# Patient Record
Sex: Male | Born: 1985 | Race: Black or African American | Hispanic: No | Marital: Single | Smoking: Never smoker
Health system: Southern US, Community
[De-identification: ages and names within clinical notes are randomized; demographics above are authoritative.]

## PROBLEM LIST (undated history)

## (undated) ENCOUNTER — Ambulatory Visit: Payer: Self-pay | Source: Home / Self Care

---
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
## 2004-10-26 ENCOUNTER — Emergency Department (HOSPITAL_COMMUNITY): Admission: EM | Admit: 2004-10-26 | Discharge: 2004-10-26 | Payer: Self-pay | Admitting: Emergency Medicine

## 2009-03-11 ENCOUNTER — Emergency Department (HOSPITAL_COMMUNITY): Admission: EM | Admit: 2009-03-11 | Discharge: 2009-03-11 | Payer: Self-pay | Admitting: Emergency Medicine

## 2009-07-31 ENCOUNTER — Emergency Department (HOSPITAL_COMMUNITY): Admission: EM | Admit: 2009-07-31 | Discharge: 2009-07-31 | Payer: Self-pay | Admitting: Emergency Medicine

## 2009-09-30 ENCOUNTER — Emergency Department (HOSPITAL_COMMUNITY): Admission: EM | Admit: 2009-09-30 | Discharge: 2009-09-30 | Payer: Self-pay | Admitting: Emergency Medicine

## 2011-04-04 LAB — COMPREHENSIVE METABOLIC PANEL
ALT: 24 U/L (ref 0–53)
AST: 33 U/L (ref 0–37)
Calcium: 9.3 mg/dL (ref 8.4–10.5)
GFR calc Af Amer: >60 mL/min (ref >60)
Sodium: 141 mEq/L (ref 135–145)
Total Protein: 7.3 g/dL (ref 6.0–8.3)

## 2011-11-28 ENCOUNTER — Emergency Department (HOSPITAL_COMMUNITY): Payer: Self-pay

## 2011-11-28 ENCOUNTER — Encounter: Payer: Self-pay | Admitting: Emergency Medicine

## 2011-11-28 ENCOUNTER — Emergency Department (HOSPITAL_COMMUNITY)
Admission: EM | Admit: 2011-11-28 | Discharge: 2011-11-29 | Disposition: A | Discharge status: Home / Self Care | Payer: Self-pay | Attending: Emergency Medicine | Admitting: Emergency Medicine

## 2011-11-28 DIAGNOSIS — S0081XA Abrasion of other part of head, initial encounter: Secondary | ICD-10-CM

## 2011-11-28 DIAGNOSIS — S0083XA Contusion of other part of head, initial encounter: Secondary | ICD-10-CM

## 2011-11-28 DIAGNOSIS — S1093XA Contusion of unspecified part of neck, initial encounter: Secondary | ICD-10-CM | POA: Insufficient documentation

## 2011-11-28 DIAGNOSIS — S0180XA Unspecified open wound of other part of head, initial encounter: Secondary | ICD-10-CM | POA: Insufficient documentation

## 2011-11-28 DIAGNOSIS — R6884 Jaw pain: Secondary | ICD-10-CM | POA: Insufficient documentation

## 2011-11-28 DIAGNOSIS — S0003XA Contusion of scalp, initial encounter: Secondary | ICD-10-CM | POA: Insufficient documentation

## 2011-11-28 DIAGNOSIS — IMO0002 Reserved for concepts with insufficient information to code with codable children: Secondary | ICD-10-CM | POA: Insufficient documentation

## 2011-11-28 MED ORDER — HYDROCODONE-ACETAMINOPHEN 5-325 MG PO TABS: 1.0000 | ORAL_TABLET | ORAL | Status: AC | PRN

## 2011-11-28 MED ORDER — TETANUS-DIPHTH-ACELL PERTUSSIS 5-2.5-18.5 LF-MCG/0.5 IM SUSP
0.5000 mL | Freq: Once | INTRAMUSCULAR | Status: AC
  Administered 2011-11-29: 0.5 mL via INTRAMUSCULAR
  Filled 2011-11-28: qty 0.5

## 2011-11-28 NOTE — ED Provider Notes (Signed)
Medical screening examination/treatment/procedure(s) were performed by non-physician practitioner and as supervising physician I was immediately available for consultation/collaboration.  Eliyahu Bille K Linker, MD 11/28/11 2347 
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
## 2013-07-06 ENCOUNTER — Emergency Department (HOSPITAL_COMMUNITY)
Admission: EM | Admit: 2013-07-06 | Discharge: 2013-07-06 | Disposition: A | Discharge status: Home / Self Care | Payer: No Typology Code available for payment source | Attending: Emergency Medicine | Admitting: Emergency Medicine

## 2013-07-06 ENCOUNTER — Emergency Department (HOSPITAL_COMMUNITY): Payer: Self-pay

## 2013-07-06 ENCOUNTER — Emergency Department (HOSPITAL_COMMUNITY): Payer: No Typology Code available for payment source

## 2013-07-06 ENCOUNTER — Encounter (HOSPITAL_COMMUNITY): Payer: Self-pay | Admitting: Nurse Practitioner

## 2013-07-06 DIAGNOSIS — S0993XA Unspecified injury of face, initial encounter: Secondary | ICD-10-CM | POA: Insufficient documentation

## 2013-07-06 DIAGNOSIS — N889 Noninflammatory disorder of cervix uteri, unspecified: Secondary | ICD-10-CM

## 2013-07-06 DIAGNOSIS — S199XXA Unspecified injury of neck, initial encounter: Secondary | ICD-10-CM | POA: Insufficient documentation

## 2013-07-06 DIAGNOSIS — S0990XA Unspecified injury of head, initial encounter: Secondary | ICD-10-CM

## 2013-07-06 DIAGNOSIS — Y9389 Activity, other specified: Secondary | ICD-10-CM | POA: Insufficient documentation

## 2013-07-06 DIAGNOSIS — R42 Dizziness and giddiness: Secondary | ICD-10-CM | POA: Insufficient documentation

## 2013-07-06 DIAGNOSIS — H538 Other visual disturbances: Secondary | ICD-10-CM | POA: Insufficient documentation

## 2013-07-06 MED ORDER — HYDROCODONE-ACETAMINOPHEN 5-325 MG PO TABS: 1.0000 | ORAL_TABLET | Freq: Four times a day (QID) | ORAL | Status: DC | PRN

## 2013-07-06 MED ORDER — IBUPROFEN 800 MG PO TABS: 800.0000 mg | ORAL_TABLET | Freq: Three times a day (TID) | ORAL | Status: DC | PRN

## 2013-07-06 MED ORDER — CYCLOBENZAPRINE HCL 10 MG PO TABS: 10.0000 mg | ORAL_TABLET | Freq: Three times a day (TID) | ORAL | Status: DC | PRN

## 2013-07-06 NOTE — ED Notes (Signed)
Patient transported to X-ray 
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
## 2013-07-06 NOTE — ED Provider Notes (Signed)
Medical screening examination/treatment/procedure(s) were performed by non-physician practitioner and as supervising physician I was immediately available for consultation/collaboration.   Ethne Jeon III, MD 07/06/13 2118 
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
## 2015-06-07 ENCOUNTER — Emergency Department (HOSPITAL_COMMUNITY)
Admission: EM | Admit: 2015-06-07 | Discharge: 2015-06-07 | Disposition: A | Discharge status: Home / Self Care | Payer: Self-pay | Attending: Emergency Medicine | Admitting: Emergency Medicine

## 2015-06-07 ENCOUNTER — Encounter (HOSPITAL_COMMUNITY): Payer: Self-pay

## 2015-06-07 DIAGNOSIS — K921 Melena: Secondary | ICD-10-CM | POA: Insufficient documentation

## 2015-06-07 DIAGNOSIS — K6289 Other specified diseases of anus and rectum: Secondary | ICD-10-CM | POA: Insufficient documentation

## 2015-06-07 DIAGNOSIS — Z79899 Other long term (current) drug therapy: Secondary | ICD-10-CM | POA: Insufficient documentation

## 2015-06-07 MED ORDER — HYDROCORTISONE 2.5 % RE CREA: TOPICAL_CREAM | RECTAL | Status: DC

## 2015-06-07 MED ORDER — DOCUSATE SODIUM 100 MG PO CAPS: 100.0000 mg | ORAL_CAPSULE | Freq: Two times a day (BID) | ORAL | Status: DC

## 2015-06-07 NOTE — ED Provider Notes (Signed)
CSN: [id number]     Arrival date & time 06/07/15  0429 History   First MD Initiated Contact with Patient 06/07/15 0444     Chief Complaint  Patient presents with  . Hemorrhoids     (Consider location/radiation/quality/duration/timing/severity/associated sxs/prior Treatment) HPI  This is a 29 year old male who presents with rectal pain and hemorrhoids. Patient reports rectal pain and hemorrhoids 2 months. He's been using Preparation H at home and sitz baths without relief. Reports the pain is Worse. He has noted some bloody stools with bowel movements. His pain does not get better after a bowel movement. He is noted the hemorrhoid gets worse after a bowel movement. He is not taking stool softeners but does not report hard stools either.  History reviewed. No pertinent past medical history. History reviewed. No pertinent past surgical history. No family history on file. History  Substance Use Topics  . Smoking status: Never Smoker   . Smokeless tobacco: Not on file  . Alcohol Use: No    Review of Systems  Constitutional: Negative for fever.  Gastrointestinal: Positive for blood in stool and rectal pain. Negative for abdominal pain.  All other systems reviewed and are negative.     Allergies  Review of patient's allergies indicates no known allergies.  Home Medications   Prior to Admission medications   Medication Sig Start Date End Date Taking? Authorizing Provider  cyclobenzaprine (FLEXERIL) 10 MG tablet Take 1 tablet (10 mg total) by mouth 3 (three) times daily as needed for muscle spasms. 07/06/13   Charlestine Night, PA-C  docusate sodium (COLACE) 100 MG capsule Take 1 capsule (100 mg total) by mouth every 12 (twelve) hours. 06/07/15   Shon Baton, MD  HYDROcodone-acetaminophen (NORCO/VICODIN) 5-325 MG per tablet Take 1 tablet by mouth every 6 (six) hours as needed for pain. 07/06/13   Charlestine Night, PA-C  hydrocortisone (ANUSOL-HC) 2.5 % rectal cream Apply rectally  2 times daily 06/07/15   Shon Baton, MD  ibuprofen (ADVIL,MOTRIN) 800 MG tablet Take 1 tablet (800 mg total) by mouth every 8 (eight) hours as needed for pain. 07/06/13   Christopher Lawyer, PA-C   BP 134/105 mmHg  Pulse 98  Temp(Src) 97.8 F (36.6 C) (Oral)  Resp 19  Ht 6' (1.829 m)  Wt 200 lb (90.719 kg)  BMI 27.12 kg/m2  SpO2 99% Physical Exam  Constitutional: He is oriented to person, place, and time. He appears well-developed and well-nourished. No distress.  HENT:  Head: Normocephalic and atraumatic.  Cardiovascular: Normal rate, regular rhythm and normal heart sounds.   Pulmonary/Chest: Effort normal. No respiratory distress.  Genitourinary:  Normal rectal tone, no masses noted, patient had difficulty with digital rectal exam, no obvious mass or hemorrhoid palpated, no external hemorrhoids  Musculoskeletal: He exhibits no edema.  Neurological: He is alert and oriented to person, place, and time.  Skin: Skin is warm and dry.  Psychiatric: He has a normal mood and affect.  Nursing note and vitals reviewed.   ED Course  Procedures (including critical care time) Labs Review Labs Reviewed - No data to display  Imaging Review No results found.   EKG Interpretation None      MDM   Final diagnoses:  Rectal pain    Patient presents with rectal pain and concern for hemorrhoid. Symptoms for greater than 2 months. Nontoxic on exam. No obvious palpable internal or external hemorrhoid. Patient is adamant that it's a hemorrhoid that "pops out after he poops."  We will treat  supportively as a hemorrhoid. Patient given Anusol. Encouraged to continue sitz baths and use stool softeners. Patient also given [facility] follow-up.  After history, exam, and medical workup I feel the patient has been appropriately medically screened and is safe for discharge home. Pertinent diagnoses were discussed with the patient. Patient was given return precautions.     Shon Batonourtney F Keshia Weare,  MD 06/07/15 [phone]

## 2015-06-07 NOTE — ED Notes (Signed)
Hemorrhoid x 2 months, pt states using preparation H at home and baths daily, pain unbearable today.

## 2015-06-07 NOTE — Discharge Instructions (Signed)
You were seen today for rectal pain and possible hemorrhoids. There was no hemorrhoid palpated on her exam; however, given her symptoms, will treat as a hemorrhoid. You need to continue sitz baths. He'll be given rectal cream and should take a stool softener.  Hemorrhoids Hemorrhoids are swollen veins around the rectum or anus. There are two types of hemorrhoids:   Internal hemorrhoids. These occur in the veins just inside the rectum. They may poke through to the outside and become irritated and painful.  External hemorrhoids. These occur in the veins outside the anus and can be felt as a painful swelling or hard lump near the anus. CAUSES  Pregnancy.   Obesity.   Constipation or diarrhea.   Straining to have a bowel movement.   Sitting for long periods on the toilet.  Heavy lifting or other activity that caused you to strain.  Anal intercourse. SYMPTOMS   Pain.   Anal itching or irritation.   Rectal bleeding.   Fecal leakage.   Anal swelling.   One or more lumps around the anus.  DIAGNOSIS  Your caregiver may be able to diagnose hemorrhoids by visual examination. Other examinations or tests that may be performed include:   Examination of the rectal area with a gloved hand (digital rectal exam).   Examination of anal canal using a small tube (scope).   A blood test if you have lost a significant amount of blood.  A test to look inside the colon (sigmoidoscopy or colonoscopy). TREATMENT Most hemorrhoids can be treated at home. However, if symptoms do not seem to be getting better or if you have a lot of rectal bleeding, your caregiver may perform a procedure to help make the hemorrhoids get smaller or remove them completely. Possible treatments include:   Placing a rubber band at the base of the hemorrhoid to cut off the circulation (rubber band ligation).   Injecting a chemical to shrink the hemorrhoid (sclerotherapy).   Using a tool to burn the  hemorrhoid (infrared light therapy).   Surgically removing the hemorrhoid (hemorrhoidectomy).   Stapling the hemorrhoid to block blood flow to the tissue (hemorrhoid stapling).  HOME CARE INSTRUCTIONS   Eat foods with fiber, such as whole grains, beans, nuts, fruits, and vegetables. Ask your doctor about taking products with added fiber in them (fibersupplements).  Increase fluid intake. Drink enough water and fluids to keep your urine clear or pale yellow.   Exercise regularly.   Go to the bathroom when you have the urge to have a bowel movement. Do not wait.   Avoid straining to have bowel movements.   Keep the anal area dry and clean. Use wet toilet paper or moist towelettes after a bowel movement.   Medicated creams and suppositories may be used or applied as directed.   Only take over-the-counter or prescription medicines as directed by your caregiver.   Take warm sitz baths for 15-20 minutes, 3-4 times a day to ease pain and discomfort.   Place ice packs on the hemorrhoids if they are tender and swollen. Using ice packs between sitz baths may be helpful.   Put ice in a plastic bag.   Place a towel between your skin and the bag.   Leave the ice on for 15-20 minutes, 3-4 times a day.   Do not use a donut-shaped pillow or sit on the toilet for long periods. This increases blood pooling and pain.  SEEK MEDICAL CARE IF:  You have increasing pain and swelling  that is not controlled by treatment or medicine.  You have uncontrolled bleeding.  You have difficulty or you are unable to have a bowel movement.  You have pain or inflammation outside the area of the hemorrhoids. MAKE SURE YOU:  Understand these instructions.  Will watch your condition.  Will get help right away if you are not doing well or get worse. Document Released: 12/13/2000 Document Revised: 12/02/2012 Document Reviewed: 10/20/2012 [facility] Patient Information 2015 [place], [place].  This information is not intended to replace advice given to you by your health care provider. Make sure you discuss any questions you have with your health care provider.

## 2015-06-07 NOTE — ED Notes (Signed)
Pt states that he has a Hemorid for two months now, today pain is unbearable, pt states it keeps falling out.

## 2015-06-09 ENCOUNTER — Encounter (HOSPITAL_COMMUNITY): Payer: Self-pay | Admitting: *Deleted

## 2015-06-09 ENCOUNTER — Emergency Department (INDEPENDENT_AMBULATORY_CARE_PROVIDER_SITE_OTHER)
Admission: EM | Admit: 2015-06-09 | Discharge: 2015-06-09 | Disposition: A | Discharge status: Home / Self Care | Payer: Self-pay | Source: Home / Self Care | Attending: Family Medicine | Admitting: Family Medicine

## 2015-06-09 DIAGNOSIS — K648 Other hemorrhoids: Secondary | ICD-10-CM

## 2015-06-09 MED ORDER — BENZOCAINE 20 % RE OINT: TOPICAL_OINTMENT | RECTAL | Status: DC | PRN

## 2015-06-09 NOTE — ED Provider Notes (Signed)
CSN: [id number]     Arrival date & time 06/09/15  1304 History   First MD Initiated Contact with Patient 06/09/15 1347     Chief Complaint  Patient presents with  . Rectal Pain   (Consider location/radiation/quality/duration/timing/severity/associated sxs/prior Treatment) HPI Comments: 29 year old male states that he is having hemorrhoidal pain. He has been having hemorrhoids and pain almost consistently for several years. Over the past 2-3 months it has been getting worse. He is complaining of rectal pain associated with bleeding. He has also had frequent external hemorrhoids in which he has been able to self reduce. He was in the emergency department 2 days ago with rectal pain and bleedin and was prescribed Anusol HC cream. He states that has not helped.    History reviewed. No pertinent past medical history. History reviewed. No pertinent past surgical history. History reviewed. No pertinent family history. History  Substance Use Topics  . Smoking status: Never Smoker   . Smokeless tobacco: Not on file  . Alcohol Use: No    Review of Systems  Constitutional: Positive for activity change. Negative for fever and fatigue.  Gastrointestinal: Positive for blood in stool and anal bleeding. Negative for nausea, vomiting, abdominal pain, constipation and abdominal distention.  Genitourinary: Negative.  Negative for dysuria, urgency, flank pain, decreased urine volume, discharge, penile swelling, scrotal swelling and testicular pain.  Skin: Negative.   All other systems reviewed and are negative.   Allergies  Review of patient's allergies indicates no known allergies.  Home Medications   Prior to Admission medications   Medication Sig Start Date End Date Taking? Authorizing Provider  benzocaine (AMERICAINE) 20 % rectal ointment Place rectally every 3 (three) hours as needed for pain. 06/09/15   Hayden Rasmussen, NP  cyclobenzaprine (FLEXERIL) 10 MG tablet Take 1 tablet (10 mg total) by mouth  3 (three) times daily as needed for muscle spasms. 07/06/13   Charlestine Night, PA-C  docusate sodium (COLACE) 100 MG capsule Take 1 capsule (100 mg total) by mouth every 12 (twelve) hours. 06/07/15   Shon Baton, MD  HYDROcodone-acetaminophen (NORCO/VICODIN) 5-325 MG per tablet Take 1 tablet by mouth every 6 (six) hours as needed for pain. 07/06/13   Charlestine Night, PA-C  hydrocortisone (ANUSOL-HC) 2.5 % rectal cream Apply rectally 2 times daily 06/07/15   Shon Baton, MD  ibuprofen (ADVIL,MOTRIN) 800 MG tablet Take 1 tablet (800 mg total) by mouth every 8 (eight) hours as needed for pain. 07/06/13   Christopher Lawyer, PA-C   BP 111/71 mmHg  Pulse 75  Temp(Src) 99.6 F (37.6 C) (Oral)  Resp 16  SpO2 99% Physical Exam  Constitutional: He is oriented to person, place, and time. He appears well-developed and well-nourished. No distress.  Pulmonary/Chest: Effort normal. No respiratory distress.  Genitourinary:  Normal External male genitalia. Bilateral testicles descended. No swelling or tenderness of the testicles or scrotum. No discoloration. No tenderness to the epididymal apparatus. Pressure applied to the perineum while standing reproduces terminal rectal pain. No surrounding lymphadenopathy.  Musculoskeletal: He exhibits no edema.  Neurological: He is alert and oriented to person, place, and time. He exhibits normal muscle tone.  Skin: Skin is warm.  Psychiatric: He has a normal mood and affect.  Nursing note and vitals reviewed.   ED Course  Procedures (including critical care time) Labs Review Labs Reviewed - No data to display  Imaging Review No results found.   MDM   1. Internal bleeding hemorrhoids    Use  the  benzocaine pain killing ointment with the hydrocortisone cream. Use at least 1 hour apart. Stool softener tid with meals Moist heat apps F/U with surgeon, has had these hemorroids for several months.     Hayden Rasmussen, NP 06/09/15 1500

## 2015-06-09 NOTE — ED Notes (Signed)
Pt  Reports    Rectal  Pain      Seen  In  [facility]  Days  Ago  Given meds  They  Are  Not  Helping      He  States   Noticed   Some  Rectal bleeding  As  Well

## 2015-06-09 NOTE — Discharge Instructions (Signed)
Hemorrhoids Use the benzocaine pain killing ointment with the hydrocortisone cream. Use at least 1 hour apart. Hemorrhoids are swollen veins around the rectum or anus. There are two types of hemorrhoids:   Internal hemorrhoids. These occur in the veins just inside the rectum. They may poke through to the outside and become irritated and painful.  External hemorrhoids. These occur in the veins outside the anus and can be felt as a painful swelling or hard lump near the anus. CAUSES  Pregnancy.   Obesity.   Constipation or diarrhea.   Straining to have a bowel movement.   Sitting for long periods on the toilet.  Heavy lifting or other activity that caused you to strain.  Anal intercourse. SYMPTOMS   Pain.   Anal itching or irritation.   Rectal bleeding.   Fecal leakage.   Anal swelling.   One or more lumps around the anus.  DIAGNOSIS  Your caregiver may be able to diagnose hemorrhoids by visual examination. Other examinations or tests that may be performed include:   Examination of the rectal area with a gloved hand (digital rectal exam).   Examination of anal canal using a small tube (scope).   A blood test if you have lost a significant amount of blood.  A test to look inside the colon (sigmoidoscopy or colonoscopy). TREATMENT Most hemorrhoids can be treated at home. However, if symptoms do not seem to be getting better or if you have a lot of rectal bleeding, your caregiver may perform a procedure to help make the hemorrhoids get smaller or remove them completely. Possible treatments include:   Placing a rubber band at the base of the hemorrhoid to cut off the circulation (rubber band ligation).   Injecting a chemical to shrink the hemorrhoid (sclerotherapy).   Using a tool to burn the hemorrhoid (infrared light therapy).   Surgically removing the hemorrhoid (hemorrhoidectomy).   Stapling the hemorrhoid to block blood flow to the tissue  (hemorrhoid stapling).  HOME CARE INSTRUCTIONS   Eat foods with fiber, such as whole grains, beans, nuts, fruits, and vegetables. Ask your doctor about taking products with added fiber in them (fibersupplements).  Increase fluid intake. Drink enough water and fluids to keep your urine clear or pale yellow.   Exercise regularly.   Go to the bathroom when you have the urge to have a bowel movement. Do not wait.   Avoid straining to have bowel movements.   Keep the anal area dry and clean. Use wet toilet paper or moist towelettes after a bowel movement.   Medicated creams and suppositories may be used or applied as directed.   Only take over-the-counter or prescription medicines as directed by your caregiver.   Take warm sitz baths for 15-20 minutes, 3-4 times a day to ease pain and discomfort.   Place ice packs on the hemorrhoids if they are tender and swollen. Using ice packs between sitz baths may be helpful.   Put ice in a plastic bag.   Place a towel between your skin and the bag.   Leave the ice on for 15-20 minutes, 3-4 times a day.   Do not use a donut-shaped pillow or sit on the toilet for long periods. This increases blood pooling and pain.  SEEK MEDICAL CARE IF:  You have increasing pain and swelling that is not controlled by treatment or medicine.  You have uncontrolled bleeding.  You have difficulty or you are unable to have a bowel movement.  You have  pain or inflammation outside the area of the hemorrhoids. MAKE SURE YOU:  Understand these instructions.  Will watch your condition.  Will get help right away if you are not doing well or get worse. Document Released: 12/13/2000 Document Revised: 12/02/2012 Document Reviewed: 10/20/2012 [facility] Patient Information 2015 [place], [place]. This information is not intended to replace advice given to you by your health care provider. Make sure you discuss any questions you have with your health care  provider.

## 2016-12-01 ENCOUNTER — Encounter (HOSPITAL_COMMUNITY): Payer: Self-pay

## 2016-12-01 ENCOUNTER — Emergency Department (HOSPITAL_COMMUNITY)
Admission: EM | Admit: 2016-12-01 | Discharge: 2016-12-01 | Disposition: A | Discharge status: Home / Self Care | Payer: Self-pay | Attending: Emergency Medicine | Admitting: Emergency Medicine

## 2016-12-01 DIAGNOSIS — H00014 Hordeolum externum left upper eyelid: Secondary | ICD-10-CM | POA: Insufficient documentation

## 2016-12-01 DIAGNOSIS — Z79899 Other long term (current) drug therapy: Secondary | ICD-10-CM | POA: Insufficient documentation

## 2016-12-01 MED ORDER — NAPROXEN 250 MG PO TABS: 250.0000 mg | ORAL_TABLET | Freq: Two times a day (BID) | ORAL | 0 refills | Status: DC

## 2016-12-01 MED ORDER — TETRACAINE HCL 0.5 % OP SOLN
2.0000 [drp] | Freq: Once | OPHTHALMIC | Status: DC
  Filled 2016-12-01: qty 2

## 2016-12-01 MED ORDER — FLUORESCEIN SODIUM 1 MG OP STRP
1.0000 | ORAL_STRIP | Freq: Once | OPHTHALMIC | Status: DC
  Filled 2016-12-01: qty 1

## 2016-12-01 MED ORDER — SULFAMETHOXAZOLE-TRIMETHOPRIM 800-160 MG PO TABS: 1.0000 | ORAL_TABLET | Freq: Two times a day (BID) | ORAL | 0 refills | Status: AC

## 2016-12-01 NOTE — ED Provider Notes (Signed)
[facility] Provider Note    By signing my name below, I, Xavier Hill, attest that this documentation has been prepared under the direction and in the presence of Will Daneshia Tavano, PA-C. Electronically Signed: Earmon Hill, ED Scribe. 12/01/16. 9:54 AM.   History   Chief Complaint Chief Complaint  Patient presents with  . Eye Problem    The history is provided by the patient and medical records. No language interpreter was used.    HPI Comments:  Xavier Hill is a 30 y.o. male who presents to the Emergency Department complaining of left eyelid swelling that began about four days ago. He reports associated redness and mild crusting in the morning. He does not wear contacts or glasses. He has not taken anything to treat his symptoms. Pt has applied warm compresses with no significant relief of the He denies modifying factors. He denies fever, chills, foreign body sensation, visual changes.   History reviewed. No pertinent past medical history.  There are no active problems to display for this patient.   History reviewed. No pertinent surgical history.     Home Medications    Prior to Admission medications   Medication Sig Start Date End Date Taking? Authorizing Provider  benzocaine (AMERICAINE) 20 % rectal ointment Place rectally every 3 (three) hours as needed for pain. 06/09/15   Hayden Rasmussen, NP  cyclobenzaprine (FLEXERIL) 10 MG tablet Take 1 tablet (10 mg total) by mouth 3 (three) times daily as needed for muscle spasms. 07/06/13   Charlestine Night, PA-C  docusate sodium (COLACE) 100 MG capsule Take 1 capsule (100 mg total) by mouth every 12 (twelve) hours. 06/07/15   Shon Baton, MD  HYDROcodone-acetaminophen (NORCO/VICODIN) 5-325 MG per tablet Take 1 tablet by mouth every 6 (six) hours as needed for pain. 07/06/13   Charlestine Night, PA-C  hydrocortisone (ANUSOL-HC) 2.5 % rectal cream Apply rectally 2 times daily 06/07/15   Shon Baton, MD  ibuprofen  (ADVIL,MOTRIN) 800 MG tablet Take 1 tablet (800 mg total) by mouth every 8 (eight) hours as needed for pain. 07/06/13   Charlestine Night, PA-C  naproxen (NAPROSYN) 250 MG tablet Take 1 tablet (250 mg total) by mouth 2 (two) times daily with a meal. 12/01/16   Everlene Farrier, PA-C  sulfamethoxazole-trimethoprim (BACTRIM DS,SEPTRA DS) 800-160 MG tablet Take 1 tablet by mouth 2 (two) times daily. 12/01/16 12/08/16  Everlene Farrier, PA-C    Family History No family history on file.  Social History Social History  Substance Use Topics  . Smoking status: Never Smoker  . Smokeless tobacco: Not on file  . Alcohol use No     Allergies   Patient has no known allergies.   Review of Systems Review of Systems  Constitutional: Negative for chills and fever.  HENT: Negative for congestion and mouth sores.   Eyes: Negative for photophobia, pain, discharge, redness, itching and visual disturbance.       Left eyelid swelling  Skin: Negative for rash and wound.  Neurological: Negative for light-headedness and headaches.     Physical Exam Updated Vital Signs BP 132/90 (BP Location: Left Arm)   Pulse 68   Temp 97.6 F (36.4 C) (Oral)   Resp 20   SpO2 100%   Physical Exam  Constitutional: He appears well-developed and well-nourished. No distress.  HENT:  Head: Normocephalic and atraumatic.  Right Ear: External ear normal.  Left Ear: External ear normal.  Mouth/Throat: Oropharynx is clear and moist.  Eyes: Conjunctivae and EOM are normal.  Pupils are equal, round, and reactive to light. Right eye exhibits no discharge. Left eye exhibits hordeolum. Left eye exhibits no discharge.    Visual Acuity  Right Eye Distance: 20/50 Left Eye Distance: 20/30 Bilateral Distance: 20/30  Right Eye Near:   Left Eye Near:    Bilateral Near:     Erythema and edema over left upper eyelid with focal area of inflammation consistent with hordeolum. No conjunctival injection. EOMs intact. Vision grossly  intact. No pain with EOMs. Eyelid margins are clear.   Cardiovascular: Normal rate, regular rhythm and intact distal pulses.   Pulmonary/Chest: Effort normal. No respiratory distress.  Neurological: He is alert. Coordination normal.  Skin: Skin is warm and dry. Capillary refill takes less than 2 seconds. No rash noted. He is not diaphoretic. No erythema. No pallor.  Psychiatric: He has a normal mood and affect. His behavior is normal.  Nursing note and vitals reviewed.    ED Treatments / Results   DIAGNOSTIC STUDIES: Oxygen Saturation is 100% on RA, normal by my interpretation.   COORDINATION OF CARE: 9:47 AM- Will prescribe Bactrim and give referral to ophthalmologist. Pt verbalizes understanding and agrees to plan.  Medications - No data to display Labs (all labs ordered are listed, but only abnormal results are displayed) Labs Reviewed - No data to display  EKG  EKG Interpretation None       Radiology No results found.  Procedures Procedures (including critical care time)  Medications Ordered in ED Medications - No data to display   Initial Impression / Assessment and Plan / ED Course  I have reviewed the triage vital signs and the nursing notes.  Pertinent labs & imaging results that were available during my care of the patient were reviewed by me and considered in my medical decision making (see chart for details).  Clinical Course    This  is a 30 y.o. male who presents to the Emergency Department complaining of left eyelid swelling that began about four days ago. He reports associated redness and mild crusting in the morning. He does not wear contacts or glasses.  On exam the patient is afebrile and nontoxic appearing. He has some swelling and erythema to his left upper eyelid with a focal area of inflammation. This is consistent with an hordeolum. I would margins are clear. EOMs are intact. No pain with EOMs. No concern for orbital cellulitis. Will cover with  Bactrim for any preseptal cellulitis. I encouraged him to use warm compresses and follow-up with ophthalmologist Dr. Allena KatzPatel.  Pt discharged with prescription for Bactrim and Naprosyn for pain.  Patient advised to follow up with ophthalmologist and referral given. Return precautions discussed. Patient verbalizes understanding and is agreeable with discharge. I advised the patient to follow-up with their primary care provider this week. I advised the patient to return to the emergency department with new or worsening symptoms or new concerns. The patient verbalized understanding and agreement with plan.     I personally performed the services described in this documentation, which was scribed in my presence. The recorded information has been reviewed and is accurate.      Final Clinical Impressions(s) / ED Diagnoses   Final diagnoses:  Hordeolum externum left upper eyelid    New Prescriptions New Prescriptions   NAPROXEN (NAPROSYN) 250 MG TABLET    Take 1 tablet (250 mg total) by mouth 2 (two) times daily with a meal.   SULFAMETHOXAZOLE-TRIMETHOPRIM (BACTRIM DS,SEPTRA DS) 800-160 MG TABLET    Take  1 tablet by mouth 2 (two) times daily.     Everlene FarrierWilliam Fahmida Jurich, PA-C 12/01/16 1001    Vanetta MuldersScott Zackowski, MD 12/04/16 [phone]

## 2016-12-01 NOTE — Discharge Instructions (Signed)
Please use warm compresses 6 times per day while awake.

## 2016-12-01 NOTE — ED Triage Notes (Signed)
Patient complains of left eye swelling and mild redness with drainage x 4 days, denies trauma, no blurred vision. Denies pain

## 2016-12-01 NOTE — ED Notes (Signed)
Declined W/C at D/C and was escorted to lobby by RN. 

## 2017-02-05 ENCOUNTER — Encounter (HOSPITAL_COMMUNITY): Payer: Self-pay | Admitting: Family Medicine

## 2017-02-05 ENCOUNTER — Emergency Department (HOSPITAL_COMMUNITY)
Admission: EM | Admit: 2017-02-05 | Discharge: 2017-02-05 | Disposition: A | Discharge status: Home / Self Care | Payer: Self-pay | Attending: Emergency Medicine | Admitting: Emergency Medicine

## 2017-02-05 DIAGNOSIS — Z5321 Procedure and treatment not carried out due to patient leaving prior to being seen by health care provider: Secondary | ICD-10-CM | POA: Insufficient documentation

## 2017-02-05 DIAGNOSIS — R109 Unspecified abdominal pain: Secondary | ICD-10-CM | POA: Insufficient documentation

## 2017-02-05 NOTE — ED Notes (Signed)
Patient is requesting to speak to charge RN due to not being given ice chips or a bed. Consulting civil engineerCharge RN to room. Patient laying in floor in triage room.

## 2017-02-05 NOTE — ED Notes (Signed)
Called patient for assigned room and no answer.  

## 2017-02-05 NOTE — ED Notes (Signed)
When patient arrived to triage, pt requested a room and ice chips/water. Apologized to patient about not having any available rooms but informed orders for his care would be initiated once he was triaged. Also, informed he could not have any water until assessed by a provider. When triaging, pt laid on the floor and stated he didn't care that the floor was dirty. Unable to get vital signs because would not sit in a chair. After triaging, he walked out of the triage room into the lobby with a steady gait. No vomiting while in triage room.

## 2017-02-05 NOTE — ED Triage Notes (Signed)
Per EMS, patient is complaining of abdominal pain, nausea, vomiting, diarrhea starting at 9am. Patient states he drank 3 shots of vodka last night.

## 2017-02-05 NOTE — ED Notes (Signed)
Call patient name to collect labs and no one answer

## 2017-02-06 NOTE — ED Provider Notes (Signed)
Patient left without being seen  1. Patient left without being seen       Melene Planan Jorge Retz, DO 02/06/17 0006

## 2017-06-25 ENCOUNTER — Encounter (HOSPITAL_COMMUNITY): Payer: Self-pay

## 2017-06-25 ENCOUNTER — Ambulatory Visit (HOSPITAL_COMMUNITY)
Admission: EM | Admit: 2017-06-25 | Discharge: 2017-06-25 | Disposition: A | Discharge status: Home / Self Care | Payer: Self-pay | Attending: Family Medicine | Admitting: Family Medicine

## 2017-06-25 DIAGNOSIS — K047 Periapical abscess without sinus: Secondary | ICD-10-CM

## 2017-06-25 MED ORDER — AMOXICILLIN 875 MG PO TABS: 875.0000 mg | ORAL_TABLET | Freq: Two times a day (BID) | ORAL | 0 refills | Status: DC

## 2017-06-25 NOTE — ED Triage Notes (Signed)
[facility] of [facility] pain left side pt states he may have an abscess, pt complains of sore throat, trouble swallowing, trouble chewing, and toothache x3 days, [facility] pain

## 2017-06-25 NOTE — ED Provider Notes (Signed)
CSN: [id number]     Arrival date & time 06/25/17  1857 History   None    Chief Complaint  Patient presents with  . Dental Pain   (Consider location/radiation/quality/duration/timing/severity/associated sxs/prior Treatment) Patient c/o dental pain   The history is provided by the patient.  Dental Pain  Location:  Lower Lower teeth location:  18/LL 2nd molar Quality:  Aching Severity:  Moderate Onset quality:  Sudden Duration:  5 days Timing:  Constant Chronicity:  New Previous work-up:  Dental exam Worsened by:  Nothing Ineffective treatments:  None tried Associated symptoms: congestion     History reviewed. No pertinent past medical history. History reviewed. No pertinent surgical history. History reviewed. No pertinent family history. Social History  Substance Use Topics  . Smoking status: Never Smoker  . Smokeless tobacco: Never Used  . Alcohol use Yes     Comment: "Not often", " I drunk last night"    Review of Systems  Constitutional: Negative.   HENT: Positive for congestion.   Eyes: Negative.   Respiratory: Negative.   Cardiovascular: Negative.   Gastrointestinal: Negative.   Endocrine: Negative.   Genitourinary: Negative.   Musculoskeletal: Negative.   Allergic/Immunologic: Negative.   Neurological: Negative.   Hematological: Negative.   Psychiatric/Behavioral: Negative.     Allergies  Patient has no known allergies.  Home Medications   Prior to Admission medications   Medication Sig Start Date End Date Taking? Authorizing Provider  ibuprofen (ADVIL,MOTRIN) 800 MG tablet Take 1 tablet (800 mg total) by mouth every 8 (eight) hours as needed for pain. 07/06/13  Yes Lawyer, Cristal Deerhristopher, PA-C  amoxicillin (AMOXIL) 875 MG tablet Take 1 tablet (875 mg total) by mouth 2 (two) times daily. 06/25/17   Deatra Canterxford, Romey Cohea J, FNP  benzocaine (AMERICAINE) 20 % rectal ointment Place rectally every 3 (three) hours as needed for pain. 06/09/15   Hayden RasmussenMabe, David, NP   cyclobenzaprine (FLEXERIL) 10 MG tablet Take 1 tablet (10 mg total) by mouth 3 (three) times daily as needed for muscle spasms. 07/06/13   Lawyer, Cristal Deerhristopher, PA-C  docusate sodium (COLACE) 100 MG capsule Take 1 capsule (100 mg total) by mouth every 12 (twelve) hours. 06/07/15   Horton, Mayer Maskerourtney F, MD  HYDROcodone-acetaminophen (NORCO/VICODIN) 5-325 MG per tablet Take 1 tablet by mouth every 6 (six) hours as needed for pain. 07/06/13   Lawyer, Cristal Deerhristopher, PA-C  hydrocortisone (ANUSOL-HC) 2.5 % rectal cream Apply rectally 2 times daily 06/07/15   Horton, Mayer Maskerourtney F, MD  naproxen (NAPROSYN) 250 MG tablet Take 1 tablet (250 mg total) by mouth 2 (two) times daily with a meal. 12/01/16   Everlene Farrieransie, Jibreel Fedewa, PA-C   Meds Ordered and Administered this Visit  Medications - No data to display  BP 140/89 (BP Location: Right Arm)   Pulse 60   Temp 98.8 F (37.1 C) (Oral)   Resp 16   SpO2 100%  No data found.   Physical Exam  Constitutional: He appears well-developed and well-nourished.  HENT:  Head: Normocephalic and atraumatic.  Left lower molar discomfort.  Eyes: Conjunctivae are normal. Pupils are equal, round, and reactive to light.  Neck: Normal range of motion.  Cardiovascular: Normal rate, regular rhythm and normal heart sounds.   Pulmonary/Chest: Effort normal and breath sounds normal.  Nursing note and vitals reviewed.   Urgent Care Course     Procedures (including critical care time)  Labs Review Labs Reviewed - No data to display  Imaging Review No results found.   Visual Acuity Review  Right Eye Distance:   Left Eye Distance:   Bilateral Distance:    Right Eye Near:   Left Eye Near:    Bilateral Near:         MDM   1. Dental abscess    Amoxicillin 875mg  one po bid x 10 days #20  Dental clinic hand out given  Push po fluids, rest, tylenol and motrin otc prn as directed for fever, arthralgias, and myalgias.  Follow up prn if sx's continue or persist.     Deatra Canter, [place] 06/25/17 2028

## 2017-10-10 ENCOUNTER — Ambulatory Visit (HOSPITAL_COMMUNITY)
Admission: EM | Admit: 2017-10-10 | Discharge: 2017-10-10 | Disposition: A | Discharge status: Home / Self Care | Payer: Self-pay | Attending: Family Medicine | Admitting: Family Medicine

## 2017-10-10 ENCOUNTER — Encounter (HOSPITAL_COMMUNITY): Payer: Self-pay | Admitting: Emergency Medicine

## 2017-10-10 DIAGNOSIS — K0889 Other specified disorders of teeth and supporting structures: Secondary | ICD-10-CM

## 2017-10-10 MED ORDER — PENICILLIN V POTASSIUM 500 MG PO TABS: 500.0000 mg | ORAL_TABLET | Freq: Four times a day (QID) | ORAL | 0 refills | Status: AC

## 2017-10-10 MED ORDER — BENZOCAINE 10 % MT GEL: 1.0000 "application " | OROMUCOSAL | 0 refills | Status: DC | PRN

## 2017-10-10 NOTE — Discharge Instructions (Signed)
Start Penicillin as directed for dental abscess. Ibuprofen and oragel for pain. You can do ibuprofen 600-800mg  three times a day for pain and inflammation. Follow up with dentist for further treatment and evaluation. If experiencing swelling of the throat, trouble breathing, trouble swallowing, go to the emergency department for further evaluation.

## 2017-10-10 NOTE — ED Provider Notes (Signed)
[facility]    CSN: [id number] Arrival date & time: 10/10/17  1407     History   Chief Complaint Chief Complaint  Patient presents with  . Dental Pain    HPI Xavier Hill is a 31 y.o. male.   31 year old male comes in for 4 day history of dental pain and abscess. He has known cracked tooth at the left bottom molars, was seen here in the past for same problems. He had contacted resources provided to him during last visit, but next appointment was early next year. He states he continues to keep up dental hygiene and hasn't had a problem until current episode. Denied fever during triage, but states had one episode of fever 4 days ago that was 101, has not had any since. Has had some swelling of the left lower jaw. Has been taking ibuprofen  BID and tylenol with some relief. Denies trouble breathing, trouble swallowing, swelling of the throat.       History reviewed. No pertinent past medical history.  There are no active problems to display for this patient.   History reviewed. No pertinent surgical history.     Home Medications    Prior to Admission medications   Medication Sig Start Date End Date Taking? Authorizing Provider  benzocaine (AMERICAINE) 20 % rectal ointment Place rectally every 3 (three) hours as needed for pain. 06/09/15   Hayden Rasmussen, NP  benzocaine (ORAJEL) 10 % mucosal gel Use as directed 1 application in the mouth or throat as needed for mouth pain. 10/10/17   Cathie Hoops, Alika Eppes V, PA-C  cyclobenzaprine (FLEXERIL) 10 MG tablet Take 1 tablet (10 mg total) by mouth 3 (three) times daily as needed for muscle spasms. 07/06/13   Lawyer, Cristal Deer, PA-C  docusate sodium (COLACE) 100 MG capsule Take 1 capsule (100 mg total) by mouth every 12 (twelve) hours. 06/07/15   Horton, Mayer Masker, MD  HYDROcodone-acetaminophen (NORCO/VICODIN) 5-325 MG per tablet Take 1 tablet by mouth every 6 (six) hours as needed for pain. 07/06/13   Lawyer, Cristal Deer, PA-C    hydrocortisone (ANUSOL-HC) 2.5 % rectal cream Apply rectally 2 times daily 06/07/15   Horton, Mayer Masker, MD  ibuprofen (ADVIL,MOTRIN) 800 MG tablet Take 1 tablet (800 mg total) by mouth every 8 (eight) hours as needed for pain. 07/06/13   Lawyer, Cristal Deer, PA-C  naproxen (NAPROSYN) 250 MG tablet Take 1 tablet (250 mg total) by mouth 2 (two) times daily with a meal. 12/01/16   Everlene Farrier, PA-C  penicillin v potassium (VEETID) 500 MG tablet Take 1 tablet (500 mg total) by mouth 4 (four) times daily. 10/10/17 10/17/17  Belinda Fisher, PA-C    Family History History reviewed. No pertinent family history.  Social History Social History  Substance Use Topics  . Smoking status: Never Smoker  . Smokeless tobacco: Never Used  . Alcohol use Yes     Comment: "Not often", " I drunk last night"     Allergies   Patient has no known allergies.   Review of Systems Review of Systems  Reason unable to perform ROS: See HPI as above.     Physical Exam Triage Vital Signs ED Triage Vitals  Enc Vitals Group     BP 10/10/17 1430 119/79     Pulse Rate 10/10/17 1430 61     Resp 10/10/17 1430 20     Temp 10/10/17 1430 98.7 F (37.1 C)     Temp Source 10/10/17 1430 Oral  SpO2 10/10/17 1430 98 %     Weight --      Height --      Head Circumference --      Peak Flow --      Pain Score 10/10/17 1431 9     Pain Loc --      Pain Edu? --      Excl. in GC? --    No data found.   Updated Vital Signs BP 119/79 (BP Location: Right Arm)   Pulse 61   Temp 98.7 F (37.1 C) (Oral)   Resp 20   SpO2 98%   Physical Exam  Constitutional: He is oriented to person, place, and time. He appears well-developed and well-nourished. No distress.  HENT:  Head: Normocephalic and atraumatic.  Mouth/Throat: Uvula is midline, oropharynx is clear and moist and mucous membranes are normal.  Cracked tooth seen of #18 and #19. Swelling with tenderness on palpation of the gums. Soft floor of mouth.  Eyes:  Pupils are equal, round, and reactive to light. Conjunctivae are normal.  Neurological: He is alert and oriented to person, place, and time.     UC Treatments / Results  Labs (all labs ordered are listed, but only abnormal results are displayed) Labs Reviewed - No data to display  EKG  EKG Interpretation None       Radiology No results found.  Procedures Procedures (including critical care time)  Medications Ordered in UC Medications - No data to display   Initial Impression / Assessment and Plan / UC Course  I have reviewed the triage vital signs and the nursing notes.  Pertinent labs & imaging results that were available during my care of the patient were reviewed by me and considered in my medical decision making (see chart for details).    Start antibiotics for dental abscess. Symptomatic treatment as needed. Discussed with patient symptoms can return if dental problem is not addressed. Follow up with dentist for further evaluation and treatment of dental pain. Resources given. Return precautions given.   Final Clinical Impressions(s) / UC Diagnoses   Final diagnoses:  Pain, dental    New Prescriptions Discharge Medication List as of 10/10/2017  2:42 PM    START taking these medications   Details  benzocaine (ORAJEL) 10 % mucosal gel Use as directed 1 application in the mouth or throat as needed for mouth pain., Starting Fri 10/10/2017, Normal    penicillin v potassium (VEETID) 500 MG tablet Take 1 tablet (500 mg total) by mouth 4 (four) times daily., Starting Fri 10/10/2017, Until Fri 10/17/2017, Normal          Zackrey Dyar V, PA-C 10/10/17 1449

## 2017-10-10 NOTE — ED Triage Notes (Signed)
Pt here for lower left dental pain onset 4 days  Denies fevers, chills  A&O x4... NAD... Ambulatory

## 2018-03-10 ENCOUNTER — Encounter (HOSPITAL_COMMUNITY): Payer: Self-pay | Admitting: Emergency Medicine

## 2018-03-10 ENCOUNTER — Ambulatory Visit (HOSPITAL_COMMUNITY)
Admission: EM | Admit: 2018-03-10 | Discharge: 2018-03-10 | Disposition: A | Discharge status: Home / Self Care | Payer: Self-pay | Attending: Family Medicine | Admitting: Family Medicine

## 2018-03-10 DIAGNOSIS — S0181XA Laceration without foreign body of other part of head, initial encounter: Secondary | ICD-10-CM

## 2018-03-10 DIAGNOSIS — Y9367 Activity, basketball: Secondary | ICD-10-CM

## 2018-03-10 NOTE — ED Provider Notes (Signed)
[facility]    CSN: [id number] Arrival date & time: 03/10/18  1215     History   Chief Complaint Chief Complaint  Patient presents with  . Facial Laceration    HPI Xavier Hill is a 32 y.o. male.   Patient declined Tdap today.    The history is provided by the patient.  Laceration  Location:  Face Facial laceration location: Above Right Eye. Length:  1 inch Depth:  Cutaneous Bleeding: venous   Time since incident: 11 am this morning. Injury mechanism: Got elbowed while playing basketball. Pain details:    Quality: 1/10. Foreign body present:  No foreign bodies Tetanus status:  Unknown Associated symptoms: no fever, no numbness, no redness, no swelling and no streaking     History reviewed. No pertinent past medical history.  There are no active problems to display for this patient.   History reviewed. No pertinent surgical history.     Home Medications    Prior to Admission medications   Medication Sig Start Date End Date Taking? Authorizing Provider  benzocaine (AMERICAINE) 20 % rectal ointment Place rectally every 3 (three) hours as needed for pain. 06/09/15   Hayden Rasmussen, NP  benzocaine (ORAJEL) 10 % mucosal gel Use as directed 1 application in the mouth or throat as needed for mouth pain. 10/10/17   Cathie Hoops, Amy V, PA-C  cyclobenzaprine (FLEXERIL) 10 MG tablet Take 1 tablet (10 mg total) by mouth 3 (three) times daily as needed for muscle spasms. 07/06/13   Lawyer, Cristal Deer, PA-C  docusate sodium (COLACE) 100 MG capsule Take 1 capsule (100 mg total) by mouth every 12 (twelve) hours. 06/07/15   Horton, Mayer Masker, MD  HYDROcodone-acetaminophen (NORCO/VICODIN) 5-325 MG per tablet Take 1 tablet by mouth every 6 (six) hours as needed for pain. 07/06/13   Lawyer, Cristal Deer, PA-C  hydrocortisone (ANUSOL-HC) 2.5 % rectal cream Apply rectally 2 times daily 06/07/15   Horton, Mayer Masker, MD  ibuprofen (ADVIL,MOTRIN) 800 MG tablet Take 1 tablet (800 mg total)  by mouth every 8 (eight) hours as needed for pain. 07/06/13   Lawyer, Cristal Deer, PA-C  naproxen (NAPROSYN) 250 MG tablet Take 1 tablet (250 mg total) by mouth 2 (two) times daily with a meal. 12/01/16   Everlene Farrier, PA-C    Family History No family history on file.  Social History Social History   Tobacco Use  . Smoking status: Never Smoker  . Smokeless tobacco: Never Used  Substance Use Topics  . Alcohol use: Yes    Comment: "Not often", " I drunk last night"  . Drug use: No     Allergies   Patient has no known allergies.   Review of Systems Review of Systems  Constitutional: Negative for fever.       See HPI     Physical Exam Triage Vital Signs ED Triage Vitals  Enc Vitals Group     BP 03/10/18 1349 133/70     Pulse Rate 03/10/18 1349 64     Resp 03/10/18 1349 16     Temp 03/10/18 1349 98.5 F (36.9 C)     Temp Source 03/10/18 1349 Oral     SpO2 03/10/18 1349 100 %     Weight 03/10/18 1350 210 lb (95.3 kg)     Height --      Head Circumference --      Peak Flow --      Pain Score 03/10/18 1350 0     Pain Loc --  Pain Edu? --      Excl. in GC? --    No data found.  Updated Vital Signs BP 133/70   Pulse 64   Temp 98.5 F (36.9 C) (Oral)   Resp 16   Wt 210 lb (95.3 kg)   SpO2 100%   BMI 28.48 kg/m   Visual Acuity Right Eye Distance:   Left Eye Distance:   Bilateral Distance:    Right Eye Near:   Left Eye Near:    Bilateral Near:     Physical Exam  Constitutional: He is oriented to person, place, and time. He appears well-developed and well-nourished. No distress.  Cardiovascular: Normal rate.  Pulmonary/Chest: Effort normal.  Neurological: He is alert and oriented to person, place, and time.  Skin: He is not diaphoretic.  1 inch laceration noted on right elbow, bleeding controlled.   Nursing note and vitals reviewed.    UC Treatments / Results  Labs (all labs ordered are listed, but only abnormal results are displayed) Labs  Reviewed - No data to display  EKG  EKG Interpretation None       Radiology No results found.  Procedures Laceration Repair Date/Time: 03/10/2018 2:42 PM Performed by: Lucia Estelle, NP Authorized by: Mardella Layman, MD   Consent:    Consent obtained:  Verbal   Consent given by:  Patient   Risks discussed:  Infection and need for additional repair   Alternatives discussed:  No treatment Anesthesia (see MAR for exact dosages):    Anesthesia method:  Local infiltration   Local anesthetic:  Lidocaine 1% w/o epi Laceration details:    Location:  Face   Face location:  R eyebrow   Length (cm):  2.4   Depth (mm):  2 Repair type:    Repair type:  Intermediate Pre-procedure details:    Preparation:  Patient was prepped and draped in usual sterile fashion Exploration:    Hemostasis achieved with:  Direct pressure   Wound exploration: wound explored through full range of motion     Wound extent: no foreign bodies/material noted, no nerve damage noted and no underlying fracture noted     Contaminated: no   Treatment:    Area cleansed with:  Betadine   Amount of cleaning:  Standard Skin repair:    Repair method:  Sutures   Suture size:  5-0   Suture material:  Nylon (4 Nylon 5-0 simple interrupted. 1 subcuticular using 5-0 Chromic Gut) Approximation:    Approximation:  Close   Vermilion border: well-aligned   Post-procedure details:    Dressing:  Sterile dressing   Patient tolerance of procedure:  Tolerated well, no immediate complications   (including critical care time)  Medications Ordered in UC Medications - No data to display   Initial Impression / Assessment and Plan / UC Course  I have reviewed the triage vital signs and the nursing notes.  Pertinent labs & imaging results that were available during my care of the patient were reviewed by me and considered in my medical decision making (see chart for details).   Final Clinical Impressions(s) / UC Diagnoses    Final diagnoses:  Facial laceration, initial encounter   32 y.o. Presents today for right eyebrow laceration onset 3 hrs PTA. Laceration repaired today, see procedure note above. Patient tolerated well. He declined Tdap today. Reutrn in 5 days for suture removal.   ED Discharge Orders    None     Controlled Substance Prescritions [place] Controlled Substance Registry  consulted? Not Applicable   Lucia EstelleZheng, Denee Boeder, NP 03/10/18 1446

## 2018-03-10 NOTE — Discharge Instructions (Signed)
Return in 5 days for suture removal.

## 2018-03-10 NOTE — ED Triage Notes (Signed)
PT has a laceration over right eye. This occurred while PT was playing basketball today. Bleeding controlled without bandage.

## 2018-03-16 ENCOUNTER — Ambulatory Visit (HOSPITAL_COMMUNITY)
Admission: EM | Admit: 2018-03-16 | Discharge: 2018-03-16 | Disposition: A | Discharge status: Home / Self Care | Payer: Self-pay

## 2018-03-16 DIAGNOSIS — Z4802 Encounter for removal of sutures: Secondary | ICD-10-CM

## 2018-03-16 DIAGNOSIS — S0181XD Laceration without foreign body of other part of head, subsequent encounter: Secondary | ICD-10-CM

## 2018-03-16 NOTE — ED Notes (Signed)
Pt here to have four stitches removed from R eyebrow. Wound well healed. No bleeding. 4 sutures removed. Pt had no further questions.

## 2018-04-16 ENCOUNTER — Emergency Department (HOSPITAL_COMMUNITY)
Admission: EM | Admit: 2018-04-16 | Discharge: 2018-04-16 | Discharge status: Against Medical Advice | Payer: Self-pay | Attending: Emergency Medicine | Admitting: Emergency Medicine

## 2018-04-16 ENCOUNTER — Ambulatory Visit (HOSPITAL_COMMUNITY)
Admission: EM | Admit: 2018-04-16 | Discharge: 2018-04-16 | Disposition: A | Discharge status: Home / Self Care | Payer: Self-pay | Attending: Family Medicine | Admitting: Family Medicine

## 2018-04-16 ENCOUNTER — Encounter (HOSPITAL_COMMUNITY): Payer: Self-pay | Admitting: Emergency Medicine

## 2018-04-16 ENCOUNTER — Encounter (HOSPITAL_COMMUNITY): Payer: Self-pay | Admitting: Family Medicine

## 2018-04-16 ENCOUNTER — Other Ambulatory Visit: Payer: Self-pay

## 2018-04-16 DIAGNOSIS — Y939 Activity, unspecified: Secondary | ICD-10-CM | POA: Insufficient documentation

## 2018-04-16 DIAGNOSIS — Y999 Unspecified external cause status: Secondary | ICD-10-CM | POA: Insufficient documentation

## 2018-04-16 DIAGNOSIS — W2201XA Walked into wall, initial encounter: Secondary | ICD-10-CM

## 2018-04-16 DIAGNOSIS — Y929 Unspecified place or not applicable: Secondary | ICD-10-CM | POA: Insufficient documentation

## 2018-04-16 DIAGNOSIS — Z23 Encounter for immunization: Secondary | ICD-10-CM

## 2018-04-16 DIAGNOSIS — Z5321 Procedure and treatment not carried out due to patient leaving prior to being seen by health care provider: Secondary | ICD-10-CM | POA: Insufficient documentation

## 2018-04-16 DIAGNOSIS — S0181XA Laceration without foreign body of other part of head, initial encounter: Secondary | ICD-10-CM

## 2018-04-16 DIAGNOSIS — X58XXXA Exposure to other specified factors, initial encounter: Secondary | ICD-10-CM | POA: Insufficient documentation

## 2018-04-16 MED ORDER — MELOXICAM 7.5 MG PO TABS: 7.5000 mg | ORAL_TABLET | Freq: Every day | ORAL | 0 refills | Status: DC

## 2018-04-16 MED ORDER — TETANUS-DIPHTH-ACELL PERTUSSIS 5-2.5-18.5 LF-MCG/0.5 IM SUSP
0.5000 mL | Freq: Once | INTRAMUSCULAR | Status: AC
  Administered 2018-04-16: 0.5 mL via INTRAMUSCULAR

## 2018-04-16 MED ORDER — TETANUS-DIPHTH-ACELL PERTUSSIS 5-2.5-18.5 LF-MCG/0.5 IM SUSP
INTRAMUSCULAR | Status: AC
  Filled 2018-04-16: qty 0.5
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
## 2018-04-16 NOTE — ED Notes (Signed)
Pt states he has to leave to take his children to school   Encouraged to stay but pt states he cannot

## 2018-04-16 NOTE — ED Provider Notes (Signed)
[facility]    CSN: [id number] Arrival date & time: 04/16/18  1011     History   Chief Complaint Chief Complaint  Patient presents with  . Laceration    HPI Runell GessKeyon Dayhoff is a 32 y.o. male.   32 year old male comes in for laceration to the left eyebrow.  States he was turning, when he hit his left eyebrow to the wall, causing a laceration today.  He denies loss of consciousness.  Denies nausea, vomiting, photophobia, phonophobia.  Denies weakness, dizziness, syncope.  Bleeding is controlled.     History reviewed. No pertinent past medical history.  There are no active problems to display for this patient.   History reviewed. No pertinent surgical history.     Home Medications    Prior to Admission medications   Medication Sig Start Date End Date Taking? Authorizing Provider  benzocaine (AMERICAINE) 20 % rectal ointment Place rectally every 3 (three) hours as needed for pain. Patient not taking: Reported on 04/16/2018 06/09/15   Hayden RasmussenMabe, David, NP  benzocaine (ORAJEL) 10 % mucosal gel Use as directed 1 application in the mouth or throat as needed for mouth pain. Patient not taking: Reported on 04/16/2018 10/10/17   Belinda FisherYu, Gabrien Mentink V, PA-C  cyclobenzaprine (FLEXERIL) 10 MG tablet Take 1 tablet (10 mg total) by mouth 3 (three) times daily as needed for muscle spasms. Patient not taking: Reported on 04/16/2018 07/06/13   Charlestine NightLawyer, Christopher, PA-C  docusate sodium (COLACE) 100 MG capsule Take 1 capsule (100 mg total) by mouth every 12 (twelve) hours. Patient not taking: Reported on 04/16/2018 06/07/15   Horton, Mayer Maskerourtney F, MD  HYDROcodone-acetaminophen (NORCO/VICODIN) 5-325 MG per tablet Take 1 tablet by mouth every 6 (six) hours as needed for pain. Patient not taking: Reported on 04/16/2018 07/06/13   Charlestine NightLawyer, Christopher, PA-C  hydrocortisone (ANUSOL-HC) 2.5 % rectal cream Apply rectally 2 times daily Patient not taking: Reported on 04/16/2018 06/07/15   Horton, Mayer Maskerourtney F, MD    ibuprofen (ADVIL,MOTRIN) 800 MG tablet Take 1 tablet (800 mg total) by mouth every 8 (eight) hours as needed for pain. Patient not taking: Reported on 04/16/2018 07/06/13   Charlestine NightLawyer, Christopher, PA-C  meloxicam (MOBIC) 7.5 MG tablet Take 1 tablet (7.5 mg total) by mouth daily. 04/16/18   Cathie HoopsYu, Keerthana Vanrossum V, PA-C  naproxen (NAPROSYN) 250 MG tablet Take 1 tablet (250 mg total) by mouth 2 (two) times daily with a meal. Patient not taking: Reported on 04/16/2018 12/01/16   Everlene Farrieransie, William, PA-C    Family History History reviewed. No pertinent family history.  Social History Social History   Tobacco Use  . Smoking status: Never Smoker  . Smokeless tobacco: Never Used  Substance Use Topics  . Alcohol use: Not Currently  . Drug use: Yes    Types: Marijuana     Allergies   Patient has no known allergies.   Review of Systems Review of Systems  Reason unable to perform ROS: See HPI as above.     Physical Exam Triage Vital Signs ED Triage Vitals  Enc Vitals Group     BP 04/16/18 1044 130/88     Pulse Rate 04/16/18 1044 64     Resp 04/16/18 1044 18     Temp 04/16/18 1044 98.2 F (36.8 C)     Temp src --      SpO2 04/16/18 1044 100 %     Weight --      Height --      Head Circumference --  Peak Flow --      Pain Score 04/16/18 1043 0     Pain Loc --      Pain Edu? --      Excl. in GC? --    No data found.  Updated Vital Signs BP 130/88   Pulse 64   Temp 98.2 F (36.8 C)   Resp 18   SpO2 100%   Physical Exam  Constitutional: He is oriented to person, place, and time. He appears well-developed and well-nourished. No distress.  HENT:  Head: Normocephalic and atraumatic.  Eyes: Pupils are equal, round, and reactive to light. Conjunctivae and EOM are normal.    1.8cm laceration to the left eyebrow extending to the forehead. Bleeding controlled.   Neurological: He is alert and oriented to person, place, and time. He is not disoriented. No cranial nerve deficit or sensory  deficit. Coordination and gait normal. GCS eye subscore is 4. GCS verbal subscore is 5. GCS motor subscore is 6.  Able to move eyebrow without problems. Sensation intact.      UC Treatments / Results  Labs (all labs ordered are listed, but only abnormal results are displayed) Labs Reviewed - No data to display  EKG None Radiology No results found.  Procedures Laceration Repair Date/Time: 04/16/2018 11:59 AM Performed by: Belinda Fisher, PA-C Authorized by: Frederica Kuster, MD   Consent:    Consent obtained:  Verbal   Consent given by:  Patient   Risks discussed:  Infection, pain, poor cosmetic result and poor wound healing Anesthesia (see MAR for exact dosages):    Anesthesia method:  Local infiltration   Local anesthetic:  Lidocaine 2% WITH epi Laceration details:    Location:  Face   Face location:  L eyebrow   Length (cm):  1.8   Depth (mm):  2 Repair type:    Repair type:  Simple Exploration:    Hemostasis achieved with:  Direct pressure and epinephrine   Wound exploration: wound explored through full range of motion and entire depth of wound probed and visualized   Treatment:    Area cleansed with:  Betadine   Amount of cleaning:  Standard   Irrigation solution:  Sterile water   Irrigation method:  Pressure wash Skin repair:    Repair method:  Sutures   Suture size:  5-0   Suture material:  Prolene   Suture technique:  Simple interrupted   Number of sutures:  4 Approximation:    Approximation:  Close Post-procedure details:    Dressing:  Bulky dressing   Patient tolerance of procedure:  Tolerated well, no immediate complications   (including critical care time)  Medications Ordered in UC Medications  Tdap (BOOSTRIX) injection 0.5 mL (0.5 mLs Intramuscular Given 04/16/18 1151)     Initial Impression / Assessment and Plan / UC Course  I have reviewed the triage vital signs and the nursing notes.  Pertinent labs & imaging results that were available  during my care of the patient were reviewed by me and considered in my medical decision making (see chart for details).    Patient tolerated procedure well.  4 sutures applied.  Wound care instructions given.  Tdap updated.  Return precautions given.  Otherwise follow-up in 5 days for suture removal.  Patient expresses understanding and agrees to plan.  Final Clinical Impressions(s) / UC Diagnoses   Final diagnoses:  Facial laceration, initial encounter    ED Discharge Orders  Ordered    meloxicam (MOBIC) 7.5 MG tablet  Daily     04/16/18 1143        Belinda Fisher, PA-C 04/16/18 1201

## 2018-04-16 NOTE — ED Triage Notes (Signed)
Pt here for laceration above left eye. sts that he ran into the wall. Bleeding controlled.

## 2018-04-16 NOTE — Discharge Instructions (Signed)
Tetanus updated today. 4 sutures put in today. You can clean wound with soap and water gently. Do not soak wound. Keep wound clean and dry. You can take mobic for pain as needed. Monitor for spreading redness, increased warmth, fever, follow up for reevaluation. Otherwise, follow up in 5 days for suture removal.

## 2019-03-23 ENCOUNTER — Encounter (HOSPITAL_COMMUNITY): Payer: Self-pay | Admitting: Family Medicine

## 2019-03-23 ENCOUNTER — Other Ambulatory Visit: Payer: Self-pay

## 2019-03-23 ENCOUNTER — Ambulatory Visit (HOSPITAL_COMMUNITY)
Admission: EM | Admit: 2019-03-23 | Discharge: 2019-03-23 | Disposition: A | Discharge status: Home / Self Care | Payer: Self-pay | Attending: Family Medicine | Admitting: Family Medicine

## 2019-03-23 DIAGNOSIS — H00011 Hordeolum externum right upper eyelid: Secondary | ICD-10-CM

## 2019-03-23 MED ORDER — DOXYCYCLINE HYCLATE 100 MG PO TABS: 100.0000 mg | ORAL_TABLET | Freq: Two times a day (BID) | ORAL | 0 refills | Status: DC

## 2019-03-23 NOTE — ED Notes (Signed)
Patient verbalizes understanding of discharge instructions. Opportunity for questioning and answers were provided. Patient discharged from [facility] by MD. 

## 2019-03-23 NOTE — ED Triage Notes (Addendum)
Patient presents to Urgent Care with complaints of stye on right eye since about a week ago. Patient states he has been placing hot compress on it but it has not gone away. Pt had one in past, and he received meds for it.

## 2019-03-23 NOTE — ED Provider Notes (Signed)
[facility]    CSN: [id number] Arrival date & time: 03/23/19  1241     History   Chief Complaint Chief Complaint  Patient presents with  . Eye Problem    HPI Xavier Hill is a 33 y.o. male.   Patient presents to Urgent Care with complaints of stye on right eye since about a week ago. Patient states he has been placing hot compresses on it but it has not gone away. Pt had one in past, and he received meds for it.   Patient does lawn care     History reviewed. No pertinent past medical history.  There are no active problems to display for this patient.   History reviewed. No pertinent surgical history.     Home Medications    Prior to Admission medications   Medication Sig Start Date End Date Taking? Authorizing Provider  doxycycline (VIBRA-TABS) 100 MG tablet Take 1 tablet (100 mg total) by mouth 2 (two) times daily. 03/23/19   Elvina Sidle, MD    Family History History reviewed. No pertinent family history.  Social History Social History   Tobacco Use  . Smoking status: Never Smoker  . Smokeless tobacco: Never Used  Substance Use Topics  . Alcohol use: Yes    Comment: socially  . Drug use: Yes    Types: Marijuana    Comment: daily     Allergies   Patient has no known allergies.   Review of Systems Review of Systems  Eyes: Positive for pain and redness.  All other systems reviewed and are negative.    Physical Exam Triage Vital Signs ED Triage Vitals [03/23/19 1256]  Enc Vitals Group     BP      Pulse      Resp      Temp      Temp src      SpO2      Weight      Height      Head Circumference      Peak Flow      Pain Score 5     Pain Loc      Pain Edu?      Excl. in GC?    No data found.  Updated Vital Signs BP 127/86 (BP Location: Left Arm)   Pulse 65   Temp 98.1 F (36.7 C) (Oral)   Resp 16   SpO2 100%  Physical Exam Vitals signs and nursing note reviewed.  Constitutional:      Appearance: Normal  appearance. He is normal weight.  HENT:     Head: Normocephalic.     Mouth/Throat:     Mouth: Mucous membranes are moist.  Eyes:     Conjunctiva/sclera: Conjunctivae normal.     Comments: Upper right lid is swollen laterally with mild erythema and no ptosis  Neck:     Musculoskeletal: Normal range of motion and neck supple.  Pulmonary:     Effort: Pulmonary effort is normal.  Musculoskeletal: Normal range of motion.  Skin:    General: Skin is warm and dry.  Neurological:     General: No focal deficit present.     Mental Status: He is alert and oriented to person, place, and time.  Psychiatric:        Mood and Affect: Mood normal.      UC Treatments / Results  Labs (all labs ordered are listed, but only abnormal results are displayed) Labs Reviewed - No data to display  EKG None  Radiology No results found.  Procedures Procedures (including critical care time)  Medications Ordered in UC Medications - No data to display  Initial Impression / Assessment and Plan / UC Course  I have reviewed the triage vital signs and the nursing notes.  Pertinent labs & imaging results that were available during my care of the patient were reviewed by me and considered in my medical decision making (see chart for details).    Final Clinical Impressions(s) / UC Diagnoses   Final diagnoses:  Hordeolum externum of right upper eyelid   Discharge Instructions   None    ED Prescriptions    Medication Sig Dispense Auth. Provider   doxycycline (VIBRA-TABS) 100 MG tablet Take 1 tablet (100 mg total) by mouth 2 (two) times daily. 20 tablet Elvina Sidle, MD     Controlled Substance Prescriptions [place] Controlled Substance Registry consulted? Not Applicable   Elvina Sidle, MD 03/23/19 [phone]

## 2019-03-25 ENCOUNTER — Emergency Department (HOSPITAL_COMMUNITY)
Admission: EM | Admit: 2019-03-25 | Discharge: 2019-03-25 | Disposition: A | Discharge status: Home / Self Care | Payer: Self-pay | Attending: Emergency Medicine | Admitting: Emergency Medicine

## 2019-03-25 ENCOUNTER — Encounter (HOSPITAL_COMMUNITY): Payer: Self-pay | Admitting: *Deleted

## 2019-03-25 ENCOUNTER — Other Ambulatory Visit: Payer: Self-pay

## 2019-03-25 DIAGNOSIS — R6883 Chills (without fever): Secondary | ICD-10-CM | POA: Insufficient documentation

## 2019-03-25 DIAGNOSIS — F419 Anxiety disorder, unspecified: Secondary | ICD-10-CM | POA: Insufficient documentation

## 2019-03-25 DIAGNOSIS — Y909 Presence of alcohol in blood, level not specified: Secondary | ICD-10-CM | POA: Insufficient documentation

## 2019-03-25 DIAGNOSIS — F1010 Alcohol abuse, uncomplicated: Secondary | ICD-10-CM | POA: Insufficient documentation

## 2019-03-25 DIAGNOSIS — R109 Unspecified abdominal pain: Secondary | ICD-10-CM | POA: Insufficient documentation

## 2019-03-25 MED ORDER — ONDANSETRON 4 MG PO TBDP
4.0000 mg | ORAL_TABLET | Freq: Once | ORAL | Status: AC
  Administered 2019-03-25: 4 mg via ORAL
  Filled 2019-03-25: qty 1

## 2019-03-25 NOTE — ED Notes (Signed)
Pt becoming increasingly agitated and "wanted to get some air." This RN advised pt he must stay in his room in order to receive care. Pt continued to walk around unit without shirt on and began drinking out of the sink because he stated he was "dehydrated." Asked pt to return to room again and he stated he was just going to leave. Educated on leaving and MD notified. Pt escorted out of ED by security.

## 2019-03-25 NOTE — ED Provider Notes (Signed)
[facility] EMERGENCY DEPARTMENT Provider Note   CSN: [id number] Arrival date & time: 03/25/19  3419    History   Chief Complaint Chief Complaint  Patient presents with  . Emesis    HPI Xavier Hill is a 33 y.o. male.     The history is provided by the patient.  Emesis  Severity:  Severe Timing:  Intermittent Progression:  Worsening Chronicity:  New Relieved by:  Nothing Worsened by:  Nothing Associated symptoms: abdominal pain and chills   Associated symptoms: no diarrhea   Patient presents because he thinks he has alcohol poisoning.  He reports he drank 4 beers and since that time has had vomiting and abdominal pain.  Reports he just drank too fast.  He reports he is dehydrated.  He reports he was feeling well prior to drinking alcohol   PMH-none Soc hx - alcohol use Home Medications    Prior to Admission medications   Medication Sig Start Date End Date Taking? Authorizing Provider  doxycycline (VIBRA-TABS) 100 MG tablet Take 1 tablet (100 mg total) by mouth 2 (two) times daily. 03/23/19   Elvina Sidle, MD    Family History No family history on file.  Social History Social History   Tobacco Use  . Smoking status: Never Smoker  . Smokeless tobacco: Never Used  Substance Use Topics  . Alcohol use: Yes    Comment: socially  . Drug use: Yes    Types: Marijuana    Comment: daily     Allergies   Patient has no known allergies.   Review of Systems Review of Systems  Constitutional: Positive for chills.  Gastrointestinal: Positive for abdominal pain and vomiting. Negative for diarrhea.  All other systems reviewed and are negative.    Physical Exam Updated Vital Signs BP (!) 154/107 (BP Location: Right Arm)   Pulse 64   Temp 97.6 F (36.4 C) (Oral)   Resp 18   Ht 1.829 m (6')   Wt 95.3 kg   SpO2 100%   BMI 28.48 kg/m   Physical Exam CONSTITUTIONAL: Anxious and hyperventilating, writhing in the bed HEAD:  Normocephalic/atraumatic EYES: EOMI/PERRL ENMT: Mucous membranes moist NECK: supple no meningeal signs SPINE/BACK:entire spine nontender CV: S1/S2 noted, no murmurs/rubs/gallops noted LUNGS: Lungs are clear to auscultation bilaterally, no apparent distress ABDOMEN: soft, nontender, no rebound or guarding, bowel sounds noted throughout abdomen GU:no cva tenderness NEURO: Pt is awake/alert/appropriate, moves all extremitiesx4.  No facial droop.   EXTREMITIES: pulses normal/equal, full ROM SKIN: warm, color normal PSYCH: Anxious   ED Treatments / Results  Labs (all labs ordered are listed, but only abnormal results are displayed) Labs Reviewed - No data to display  EKG None  Radiology No results found.  Procedures Procedures (including critical care time)  Medications Ordered in ED Medications  ondansetron (ZOFRAN-ODT) disintegrating tablet 4 mg (4 mg Oral Given 03/25/19 0234)     Initial Impression / Assessment and Plan / ED Course  I have reviewed the triage vital signs and the nursing notes.      2:49 AM Patient thinks he has alcohol poisoning after drinking 4 beers He appears anxious, walking around the ER spitting in the floor. No focal tenderness noted on my exam.  No signs of trauma.  Patient appears to be very anxious. 2:51 AM Patient told the nurse that he did would like to leave the hospital.  Patient seen walking out in no distress Final Clinical Impressions(s) / ED Diagnoses   Final  diagnoses:  Alcohol abuse    ED Discharge Orders    None       Zadie Rhine, MD 03/25/19 [phone]

## 2019-03-25 NOTE — ED Triage Notes (Signed)
Pt reports he is here for "alcohol poisoning". Says that he has been drinking over the past three hours he has had 4 beers "I just drank it too fast". I feel like I am dehydrated, reports vomiting and left sided pain.

## 2020-02-02 ENCOUNTER — Other Ambulatory Visit: Payer: Self-pay

## 2020-02-02 ENCOUNTER — Ambulatory Visit (INDEPENDENT_AMBULATORY_CARE_PROVIDER_SITE_OTHER): Payer: Self-pay

## 2020-02-02 ENCOUNTER — Ambulatory Visit (HOSPITAL_COMMUNITY)
Admission: EM | Admit: 2020-02-02 | Discharge: 2020-02-02 | Disposition: A | Discharge status: Home / Self Care | Payer: Self-pay | Attending: Emergency Medicine | Admitting: Emergency Medicine

## 2020-02-02 ENCOUNTER — Encounter (HOSPITAL_COMMUNITY): Payer: Self-pay | Admitting: Emergency Medicine

## 2020-02-02 DIAGNOSIS — S161XXA Strain of muscle, fascia and tendon at neck level, initial encounter: Secondary | ICD-10-CM

## 2020-02-02 DIAGNOSIS — M542 Cervicalgia: Secondary | ICD-10-CM

## 2020-02-02 MED ORDER — TIZANIDINE HCL 4 MG PO TABS: 4.0000 mg | ORAL_TABLET | Freq: Three times a day (TID) | ORAL | 0 refills | Status: DC | PRN

## 2020-02-02 MED ORDER — PREDNISONE 10 MG (21) PO TBPK: ORAL_TABLET | ORAL | 0 refills | Status: DC

## 2020-02-02 MED ORDER — IBUPROFEN 600 MG PO TABS: 600.0000 mg | ORAL_TABLET | Freq: Four times a day (QID) | ORAL | 0 refills | Status: DC | PRN
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
## 2020-02-02 NOTE — Discharge Instructions (Addendum)
Your x-ray was negative for any acute fractures or findings.  I suspect that you have strained your neck.  Take 600 mg of ibuprofen combined with 1 g of Tylenol together 3 or 4 times a day as needed for pain.  Zanaflex will help with any muscle spasms.  When to send you home with some prednisone as well for acute inflammation.  Follow-up with your primary care physician of your choice, see list below  Below is a list of primary care practices who are taking new patients for you to follow-up with.  [facility] internal medicine clinic Ground Floor - [facility], [address] [place], [place], [place] [address] [phone]  [facility] Primary Care at [facility] [address] [place], [place] [address] [phone]  [facility] [address] [place], [place] [address] [phone]  [facility] [phone] [address] [place] [address]  [facility]: [address] [place] [address]  [phone]  [facility]: [address] [place] [address]   [phone]  [facility] Family Medicine: [address] [place] [address]  [phone]   primary care : [address] [place] [address] [phone]  [facility] Primary [facility]: [address] [place] [phone]  [facility]: [address] [place] [address] [phone]  Dr. Oneal Grout [address] [facility] [place] [address]  [phone]  Dr. Jackie Plum, [facility]. [address] [place], [place] [address]  [phone]  Go to www.goodrx.com to look up your medications. This will give you a list of where you can find your prescriptions at the most affordable prices.  Or ask the pharmacist what the cash price is, or if they have any other discount programs available to help make your medication more affordable. This can be less expensive than what you would pay with insurance.

## 2020-08-14 ENCOUNTER — Other Ambulatory Visit: Payer: Self-pay

## 2020-08-14 ENCOUNTER — Encounter (HOSPITAL_COMMUNITY): Payer: Self-pay

## 2020-08-14 ENCOUNTER — Ambulatory Visit (HOSPITAL_COMMUNITY)
Admission: EM | Admit: 2020-08-14 | Discharge: 2020-08-14 | Disposition: A | Discharge status: Home / Self Care | Payer: Self-pay | Attending: Family Medicine | Admitting: Family Medicine

## 2020-08-14 DIAGNOSIS — H00012 Hordeolum externum right lower eyelid: Secondary | ICD-10-CM

## 2020-08-14 MED ORDER — SULFAMETHOXAZOLE-TRIMETHOPRIM 800-160 MG PO TABS: 1.0000 | ORAL_TABLET | Freq: Two times a day (BID) | ORAL | 0 refills | Status: DC

## 2020-08-14 NOTE — ED Triage Notes (Signed)
Right upper eyelid pain and swelling  X 3 days, denies vision changes

## 2020-08-14 NOTE — ED Provider Notes (Signed)
[facility]    CSN: [id number] Arrival date & time: 08/14/20  1658      History   Chief Complaint Chief Complaint  Patient presents with  . Eye Pain    HPI Xavier Hill is a 34 y.o. male.   He is presenting with lesion of the right lower eyelid.  He has had similar instances in the past.  Denies any changes in his vision.  No foreign body.  Having increasing pain and soreness.  Has tried warm compress with no improvement.  HPI  History reviewed. No pertinent past medical history.  There are no problems to display for this patient.   History reviewed. No pertinent surgical history.     Home Medications    Prior to Admission medications   Medication Sig Start Date End Date Taking? Authorizing Provider  sulfamethoxazole-trimethoprim (BACTRIM DS) 800-160 MG tablet Take 1 tablet by mouth 2 (two) times daily. 08/14/20   Myra Rude, MD    Family History Family History  Problem Relation Age of Onset  . Healthy Mother   . Healthy Father     Social History Social History   Tobacco Use  . Smoking status: Never Smoker  . Smokeless tobacco: Never Used  Vaping Use  . Vaping Use: Never used  Substance Use Topics  . Alcohol use: Yes    Comment: socially  . Drug use: Yes    Types: Marijuana    Comment: daily     Allergies   Patient has no known allergies.   Review of Systems Review of Systems  See HPI  Physical Exam Triage Vital Signs ED Triage Vitals [08/14/20 1803]  Enc Vitals Group     BP 128/78     Pulse Rate 63     Resp 16     Temp 97.8 F (36.6 C)     Temp src      SpO2 100 %     Weight      Height      Head Circumference      Peak Flow      Pain Score 4     Pain Loc      Pain Edu?      Excl. in GC?    No data found.  Updated Vital Signs BP 128/78   Pulse 63   Temp 97.8 F (36.6 C)   Resp 16   SpO2 100%   Visual Acuity Right Eye Distance:   Left Eye Distance:   Bilateral Distance:    Right Eye Near:     Left Eye Near:    Bilateral Near:     Physical Exam Gen: NAD, alert, cooperative with exam, well-appearing ENT: normal lips, normal nasal mucosa,  Eye: normal EOM, redness swelling and tenderness of the right lower eyelid. Skin: no rashes, no areas of induration  Neuro: normal tone, normal sensation to touch Psych:  normal insight, alert and oriented    UC Treatments / Results  Labs (all labs ordered are listed, but only abnormal results are displayed) Labs Reviewed - No data to display  EKG   Radiology No results found.  Procedures Procedures (including critical care time)  Medications Ordered in UC Medications - No data to display  Initial Impression / Assessment and Plan / UC Course  I have reviewed the triage vital signs and the nursing notes.  Pertinent labs & imaging results that were available during my care of the patient were reviewed by me and considered  in my medical decision making (see chart for details).     Xavier Hill is a 34 year old male that is presenting with symptoms suggestive of hordeolum.  Has tried conservative measures with no improvement.  Symptoms seem to be worsening.  Will provide with Bactrim.  Counseled on supportive care.  Given indications on follow-up.  Final Clinical Impressions(s) / UC Diagnoses   Final diagnoses:  Hordeolum externum of right lower eyelid     Discharge Instructions     Please continue the warm compress  Please try the medicine  Please follow up with the eye doctor if the symptoms fail to improve     ED Prescriptions    Medication Sig Dispense Auth. Provider   sulfamethoxazole-trimethoprim (BACTRIM DS) 800-160 MG tablet Take 1 tablet by mouth 2 (two) times daily. 14 tablet Myra Rude, MD     PDMP not reviewed this encounter.   Myra Rude, MD 08/14/20 2126

## 2020-08-14 NOTE — Discharge Instructions (Signed)
Please continue the warm compress  Please try the medicine  Please follow up with the eye doctor if the symptoms fail to improve

## 2020-09-11 ENCOUNTER — Other Ambulatory Visit: Payer: Self-pay

## 2020-09-11 ENCOUNTER — Ambulatory Visit (HOSPITAL_COMMUNITY): Admission: EM | Admit: 2020-09-11 | Discharge: 2020-09-11 | Discharge status: Against Medical Advice | Payer: Self-pay

## 2020-09-12 ENCOUNTER — Other Ambulatory Visit: Payer: Self-pay

## 2020-09-12 ENCOUNTER — Encounter (HOSPITAL_COMMUNITY): Payer: Self-pay | Admitting: Emergency Medicine

## 2020-09-12 ENCOUNTER — Ambulatory Visit (HOSPITAL_COMMUNITY)
Admission: EM | Admit: 2020-09-12 | Discharge: 2020-09-12 | Disposition: A | Discharge status: Home / Self Care | Payer: Self-pay | Attending: Physician Assistant | Admitting: Physician Assistant

## 2020-09-12 DIAGNOSIS — M79673 Pain in unspecified foot: Secondary | ICD-10-CM

## 2020-09-12 DIAGNOSIS — R200 Anesthesia of skin: Secondary | ICD-10-CM

## 2020-09-12 DIAGNOSIS — B353 Tinea pedis: Secondary | ICD-10-CM

## 2020-09-12 DIAGNOSIS — B351 Tinea unguium: Secondary | ICD-10-CM

## 2020-09-12 LAB — CBG MONITORING, ED: Glucose-Capillary: 87 mg/dL (ref 70–99)

## 2020-09-12 MED ORDER — CLOTRIMAZOLE 1 % EX CREA: TOPICAL_CREAM | CUTANEOUS | 0 refills | Status: DC

## 2020-09-12 MED ORDER — IBUPROFEN 600 MG PO TABS: 600.0000 mg | ORAL_TABLET | Freq: Four times a day (QID) | ORAL | 0 refills | Status: DC | PRN

## 2020-09-12 NOTE — ED Provider Notes (Signed)
[facility]    CSN: [id number] Arrival date & time: 09/12/20  9242      History   Chief Complaint Chief Complaint  Patient presents with  . Foot Pain    HPI Xavier Hill is a 34 y.o. male.   Patient presents for evaluation of numbness and tingling on the outsides of his feet.  He reports he has had an ankle bracelet on the ankles, initially on the left.  He states after several months of having the ankle bracelet on the left he developed some numbness and tingling on the outside of the left foot about a month ago.  He had the ankle bracelet moved to his right foot about a week ago.  Since then he has developed numbness and tingling on the outside of the right foot in a similar pattern to the left.  The left has not improved but not worsened.  He describes having an achy pain at times on the inside of the right foot.  He also endorses tingling the back of his left Achilles at times.  Denies any tingling, numbness or pain in the rest of the foot.  Has noted that he has had some itching and discoloration between his toes for quite some time.  Denies pain there.  Has tried fungal treatments but not consistently.  Denies having history of issues with sugar, blood pressure or other concerns.  Does not have a primary care provider.  Denies increased thirst, increased urination, no weight gain or weight loss.  Denies numbness or tingling anywhere else.     History reviewed. No pertinent past medical history.  There are no problems to display for this patient.   History reviewed. No pertinent surgical history.     Home Medications    Prior to Admission medications   Medication Sig Start Date End Date Taking? Authorizing Provider  clotrimazole (LOTRIMIN) 1 % cream Apply to affected area 2 times daily 09/12/20   Berlynn Warsame, Veryl Speak, PA-C  ibuprofen (ADVIL) 600 MG tablet Take 1 tablet (600 mg total) by mouth every 6 (six) hours as needed. 09/12/20   Taft Worthing, Veryl Speak, PA-C    sulfamethoxazole-trimethoprim (BACTRIM DS) 800-160 MG tablet Take 1 tablet by mouth 2 (two) times daily. 08/14/20   Myra Rude, MD    Family History Family History  Problem Relation Age of Onset  . Healthy Mother   . Healthy Father     Social History Social History   Tobacco Use  . Smoking status: Never Smoker  . Smokeless tobacco: Never Used  Vaping Use  . Vaping Use: Never used  Substance Use Topics  . Alcohol use: Yes    Comment: socially  . Drug use: Yes    Types: Marijuana    Comment: daily     Allergies   Patient has no known allergies.   Review of Systems Review of Systems   Physical Exam Triage Vital Signs ED Triage Vitals  Enc Vitals Group     BP 09/12/20 0850 (!) 146/95     Pulse Rate 09/12/20 0850 62     Resp 09/12/20 0850 16     Temp 09/12/20 0850 98.2 F (36.8 C)     Temp Source 09/12/20 0850 Oral     SpO2 09/12/20 0850 99 %     Weight --      Height --      Head Circumference --      Peak Flow --  Pain Score 09/12/20 0851 2     Pain Loc --      Pain Edu? --      Excl. in GC? --    No data found.  Updated Vital Signs BP (!) 146/95 (BP Location: Right Arm)   Pulse 62   Temp 98.2 F (36.8 C) (Oral)   Resp 16   SpO2 99%   Visual Acuity Right Eye Distance:   Left Eye Distance:   Bilateral Distance:    Right Eye Near:   Left Eye Near:    Bilateral Near:     Physical Exam Vitals and nursing note reviewed.  Constitutional:      Appearance: He is well-developed.  HENT:     Head: Normocephalic and atraumatic.  Eyes:     Conjunctiva/sclera: Conjunctivae normal.  Cardiovascular:     Rate and Rhythm: Normal rate.  Pulmonary:     Effort: Pulmonary effort is normal. No respiratory distress.  Musculoskeletal:     Cervical back: Neck supple.     Comments: Ankle monitoring bracelet on right lower extremity, loose fitting.  There is no deformity, swelling or ecchymosis of either lower extremity.  Patient freely moving the  lower extremities through full range of motion ambulatory with good gait.  Skin is warm and well-perfused.  Dorsalis pedis pulse 2+.  Strength 5/5 with plantar and dorsiflexion the ankle, flexion, extension at the knee and hip  There is diminished sensation on the lateral foot just below the lateral malleoli to mid foot.  There is good differentiation between sharp and dull through these areas.  Patient has intact equal bilateral sensation through the dorsum of the foot, plantar surface and medial foot.  There is some diminished sensation along the left posterior leg, in the distribution of the Achilles tendon.  No tenderness.  There is bilateral tinea pedis in the toe webspaces.  There is nail thickening primarily of the great toes with evidence of fungal infection.  No discharge, erythema or tenderness of the digits.   Skin:    General: Skin is warm and dry.  Neurological:     General: No focal deficit present.     Mental Status: He is alert and oriented to person, place, and time.     Motor: No weakness.     Gait: Gait normal.      UC Treatments / Results  Labs (all labs ordered are listed, but only abnormal results are displayed) Labs Reviewed  CBG MONITORING, ED    EKG   Radiology No results found.  Procedures Procedures (including critical care time)  Medications Ordered in UC Medications - No data to display  Initial Impression / Assessment and Plan / UC Course  I have reviewed the triage vital signs and the nursing notes.  Pertinent labs & imaging results that were available during my care of the patient were reviewed by me and considered in my medical decision making (see chart for details).     #Numbness in feet #Tinea pedis #Onychomycosis Patient is a 34 year old presenting for evaluation of numbness and tingling in the outside of both feet.  Also found incidentally to have onychomycosis and tinea pedis of both feet.  CBG 87, doubt diabetic neuropathy.  This  may possibly related to ankle monitoring bracelet given symptoms to getting on the left while wearing left bracelet, and then initiation of symptoms after removing brace to right leg.  He is otherwise neurologically intact here without focal deficits in the lower extremities.  Discussed initiation of oral medicines for his onychomycosis, however stated that we would need to check his liver functions given the medications, patient would like to defer this as he is primarily here for the numbness in his feet.  He is concerned about permanent nerve damage and was seeking evaluation for this primarily.  He is amenable to trying clotrimazole topically.  Encouraged him to establish with primary care and will refer him to sports medicine for further evaluation of his numbness.  We will also start on ibuprofen.  Patient is agreeable to this plan of care. Final Clinical Impressions(s) / UC Diagnoses   Final diagnoses:  Numbness in feet  Tinea pedis of both feet  Onychomycosis     Discharge Instructions     Take the ibuprofen with food as prescribed Call Dr. Cyndia Diver office today for close follow-up and evaluation of your numbness and tingling in your feet  He deferred further work-up for the fungal infection, try the cream as directed Call the internal medicine center to establish with a primary care provider      ED Prescriptions    Medication Sig Dispense Auth. Provider   ibuprofen (ADVIL) 600 MG tablet Take 1 tablet (600 mg total) by mouth every 6 (six) hours as needed. 30 tablet Duran Ohern, Veryl Speak, PA-C   clotrimazole (LOTRIMIN) 1 % cream Apply to affected area 2 times daily 15 g Chrisie Jankovich, Veryl Speak, PA-C     PDMP not reviewed this encounter.   Hermelinda Medicus, PA-C 09/12/20 1049

## 2020-09-12 NOTE — Discharge Instructions (Addendum)
Take the ibuprofen with food as prescribed Call Dr. Cyndia Diver office today for close follow-up and evaluation of your numbness and tingling in your feet  He deferred further work-up for the fungal infection, try the cream as directed Call the internal medicine center to establish with a primary care provider

## 2020-09-12 NOTE — ED Triage Notes (Signed)
Patient presents to [facility] for assessment of bilateral tingling/numbness, worsening x 1 month.  Patient states he had his ankle bracelet on his left ankle for a while, and it began in that foot.  Since, he has moved it to his right ankle, now having it in the right, but the left has not improved back to normal.

## 2020-09-13 ENCOUNTER — Other Ambulatory Visit: Payer: Self-pay

## 2020-09-13 ENCOUNTER — Encounter: Payer: Self-pay | Admitting: Family Medicine

## 2020-09-13 ENCOUNTER — Ambulatory Visit (INDEPENDENT_AMBULATORY_CARE_PROVIDER_SITE_OTHER): Payer: Self-pay | Admitting: Family Medicine

## 2020-09-13 VITALS — BP 136/82 | HR 62 | Ht 72.0 in | Wt 210.0 lb

## 2020-09-13 DIAGNOSIS — R202 Paresthesia of skin: Secondary | ICD-10-CM

## 2020-09-13 MED ORDER — PREDNISONE 5 MG PO TABS: ORAL_TABLET | ORAL | 0 refills | Status: DC

## 2020-09-13 NOTE — Patient Instructions (Signed)
Nice to meet you Please try the medicine  The neurologist office will be giving you a call.   Please send me a message in MyChart with any questions or updates.  We will do a virtual visit once the EMG is resulted.   --Dr. Jordan Likes

## 2020-09-13 NOTE — Progress Notes (Signed)
Xavier Hill - 34 y.o. male MRN [id number]  Date of birth: 19-Jan-1986  SUBJECTIVE:  Including CC & ROS.  Chief Complaint  Patient presents with  . Foot Pain    bilateral foot    Xavier Hill is a 34 y.o. male that is presenting with paresthesias in the left leg and right foot.  He has been wearing a home monitoring device on the left lower leg since January.  It was moved to the right leg about 3 weeks ago.  Previously few been removed to the opposite side, the lower leg and left lateral foot were having altered sensation.  Seems to be worse with exercise.  He had no history of prior sensation change.  Seems of gotten progressively worse.   Review of Systems See HPI   HISTORY: Past Medical, Surgical, Social, and Family History Reviewed & Updated per EMR.   Pertinent Historical Findings include:  No past medical history on file.  No past surgical history on file.  Family History  Problem Relation Age of Onset  . Healthy Mother   . Healthy Father     Social History   Socioeconomic History  . Marital status: Single    Spouse name: Not on file  . Number of children: Not on file  . Years of education: Not on file  . Highest education level: Not on file  Occupational History  . Not on file  Tobacco Use  . Smoking status: Never Smoker  . Smokeless tobacco: Never Used  Vaping Use  . Vaping Use: Never used  Substance and Sexual Activity  . Alcohol use: Yes    Comment: socially  . Drug use: Yes    Types: Marijuana    Comment: daily  . Sexual activity: Not on file  Other Topics Concern  . Not on file  Social History Narrative  . Not on file   Social Determinants of Health   Financial Resource Strain:   . Difficulty of Paying Living Expenses: Not on file  Food Insecurity:   . Worried About Programme researcher, broadcasting/film/video in the Last Year: Not on file  . Ran Out of Food in the Last Year: Not on file  Transportation Needs:   . Lack of Transportation (Medical): Not on file  .  Lack of Transportation (Non-Medical): Not on file  Physical Activity:   . Days of Exercise per Week: Not on file  . Minutes of Exercise per Session: Not on file  Stress:   . Feeling of Stress : Not on file  Social Connections:   . Frequency of Communication with Friends and Family: Not on file  . Frequency of Social Gatherings with Friends and Family: Not on file  . Attends Religious Services: Not on file  . Active Member of Clubs or Organizations: Not on file  . Attends Banker Meetings: Not on file  . Marital Status: Not on file  Intimate Partner Violence:   . Fear of Current or Ex-Partner: Not on file  . Emotionally Abused: Not on file  . Physically Abused: Not on file  . Sexually Abused: Not on file     PHYSICAL EXAM:  VS: BP 136/82   Pulse 62   Ht 6' (1.829 m)   Wt 210 lb (95.3 kg)   BMI 28.48 kg/m  Physical Exam Gen: NAD, alert, cooperative with exam, well-appearing MSK:  Right and left foot: No signs of atrophy. Normal strength resistance with plantarflexion and dorsiflexion. Normal strength resistance with eversion  and inversion. Neurovascularly intact     ASSESSMENT & PLAN:   Paresthesia of both lower extremities He has been having altered sensation in the left foot since wearing a monitor around his ankle.  Concern that this may be leading to the paresthesia he is experiencing in the left foot and on the right foot. -Counseled supportive care. -Referral to neurology for nerve study. -Prednisone.

## 2020-09-14 DIAGNOSIS — R202 Paresthesia of skin: Secondary | ICD-10-CM | POA: Insufficient documentation

## 2020-09-14 NOTE — Assessment & Plan Note (Signed)
He has been having altered sensation in the left foot since wearing a monitor around his ankle.  Concern that this may be leading to the paresthesia he is experiencing in the left foot and on the right foot. -Counseled supportive care. -Referral to neurology for nerve study. -Prednisone.
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
## 2022-01-26 ENCOUNTER — Other Ambulatory Visit: Payer: Self-pay

## 2022-01-26 ENCOUNTER — Encounter (HOSPITAL_COMMUNITY): Payer: Self-pay | Admitting: *Deleted

## 2022-01-26 ENCOUNTER — Ambulatory Visit (HOSPITAL_COMMUNITY)
Admission: EM | Admit: 2022-01-26 | Discharge: 2022-01-26 | Disposition: A | Discharge status: Home / Self Care | Payer: Self-pay | Attending: Emergency Medicine | Admitting: Emergency Medicine

## 2022-01-26 DIAGNOSIS — K0889 Other specified disorders of teeth and supporting structures: Secondary | ICD-10-CM

## 2022-01-26 MED ORDER — CHLORHEXIDINE GLUCONATE 0.12 % MT SOLN: 15.0000 mL | Freq: Three times a day (TID) | OROMUCOSAL | 0 refills | Status: AC

## 2022-01-26 MED ORDER — IBUPROFEN 800 MG PO TABS: 800.0000 mg | ORAL_TABLET | Freq: Three times a day (TID) | ORAL | 0 refills | Status: DC

## 2022-01-26 MED ORDER — AMOXICILLIN-POT CLAVULANATE 875-125 MG PO TABS: 1.0000 | ORAL_TABLET | Freq: Two times a day (BID) | ORAL | 1 refills | Status: DC

## 2022-01-26 NOTE — ED Provider Notes (Signed)
[facility]   [id number] 01/26/22 Arrival Time: 6  Chief Complaint  Patient presents with   Dental Pain     SUBJECTIVE:  Xavier Hill is a 36 y.o. male who presents with dental pain for the past few days.  Denies a precipitating event or trauma.  Localizes pain to right upper gum and tooth.  Has tried OTC analgesics without relief.  States he has previously seen a dentist and was prescribed an antibiotic.  He does not have the money to have his teeth pulled.  The symptom is worse with chewing.   Reports similar symptoms in the past.  Denies fever, chills, dysphagia, odynophagia, oral or neck swelling, nausea, vomiting, chest pain, SOB.    ROS: As per HPI.  All other pertinent ROS negative.     History reviewed. No pertinent past medical history. History reviewed. No pertinent surgical history. No Known Allergies No current facility-administered medications on file prior to encounter.   Current Outpatient Medications on File Prior to Encounter  Medication Sig Dispense Refill   clotrimazole (LOTRIMIN) 1 % cream Apply to affected area 2 times daily 15 g 0   predniSONE (DELTASONE) 5 MG tablet Take 6 pills for first day, 5 pills second day, 4 pills third day, 3 pills fourth day, 2 pills the fifth day, and 1 pill sixth day. 21 tablet 0   sulfamethoxazole-trimethoprim (BACTRIM DS) 800-160 MG tablet Take 1 tablet by mouth 2 (two) times daily. 14 tablet 0   Social History   Socioeconomic History   Marital status: Single    Spouse name: Not on file   Number of children: Not on file   Years of education: Not on file   Highest education level: Not on file  Occupational History   Not on file  Tobacco Use   Smoking status: Never   Smokeless tobacco: Never  Vaping Use   Vaping Use: Never used  Substance and Sexual Activity   Alcohol use: Yes    Comment: socially   Drug use: Yes    Types: Marijuana    Comment: daily   Sexual activity: Not on file  Other Topics  Concern   Not on file  Social History Narrative   Not on file   Social Determinants of Health   Financial Resource Strain: Not on file  Food Insecurity: Not on file  Transportation Needs: Not on file  Physical Activity: Not on file  Stress: Not on file  Social Connections: Not on file  Intimate Partner Violence: Not on file   Family History  Problem Relation Age of Onset   Healthy Mother    Healthy Father     OBJECTIVE:  Vitals:   01/26/22 1031  BP: 129/81  Pulse: 84  Resp: 18  Temp: 98.4 F (36.9 C)  SpO2: 100%    General appearance: alert; no distress HENT: normocephalic; atraumatic; dentition: fair; poor over right upper gums with areas of fluctuance Neck: supple without LAD Lungs: normal respirations Skin: warm and dry Psychological: alert and cooperative; normal mood and affect  ASSESSMENT & PLAN:  1. Pain, dental     Meds ordered this encounter  Medications   chlorhexidine (PERIDEX) 0.12 % solution    Sig: Use as directed 15 mLs in the mouth or throat in the morning, at noon, and at bedtime for 7 days.    Dispense:  315 mL    Refill:  0   amoxicillin-clavulanate (AUGMENTIN) 875-125 MG tablet    Sig: Take 1  tablet by mouth every 12 (twelve) hours.    Dispense:  14 tablet    Refill:  1   ibuprofen (ADVIL) 800 MG tablet    Sig: Take 1 tablet (800 mg total) by mouth 3 (three) times daily.    Dispense:  21 tablet    Refill:  0    Discharge Instructions  Ibuprofen was prescribed/take as directed for pain Augmentin was prescribed Chlorhexidine mouthwash was prescribed Recommend soft diet until evaluated by dentist Maintain oral hygiene care Follow up with dentist as soon as possible for further evaluation and treatment  Return or go to the ED if you have any new or worsening symptoms such as fever, chills, difficulty swallowing, painful swallowing, oral or neck swelling, nausea, vomiting, chest pain, SOB, etc...  Reviewed expectations re: course of  current medical issues. Questions answered. Outlined signs and symptoms indicating need for more acute intervention. Patient verbalized understanding. After Visit Summary given.    Emerson Monte, FNP 01/26/22 1056

## 2022-01-26 NOTE — ED Triage Notes (Addendum)
Dental pain on Rt side. Pt reports pain and swelling to Rt side of face. Pt reports he has seen his dentist but did not return to have his tooth pulled . Pt is requesting a strong anti-bx.

## 2022-01-26 NOTE — Discharge Instructions (Addendum)
Ibuprofen was prescribed/take as directed for pain Augmentin was prescribed Chlorhexidine mouthwash was prescribed Recommend soft diet until evaluated by dentist Maintain oral hygiene care Follow up with dentist as soon as possible for further evaluation and treatment  Return or go to the ED if you have any new or worsening symptoms such as fever, chills, difficulty swallowing, painful swallowing, oral or neck swelling, nausea, vomiting, chest pain, SOB, etc..Marland Kitchen

## 2022-03-13 ENCOUNTER — Ambulatory Visit (HOSPITAL_COMMUNITY)
Admission: EM | Admit: 2022-03-13 | Discharge: 2022-03-13 | Disposition: A | Discharge status: Home / Self Care | Payer: Self-pay | Attending: Family Medicine | Admitting: Family Medicine

## 2022-03-13 ENCOUNTER — Other Ambulatory Visit: Payer: Self-pay

## 2022-03-13 ENCOUNTER — Encounter (HOSPITAL_COMMUNITY): Payer: Self-pay | Admitting: Emergency Medicine

## 2022-03-13 DIAGNOSIS — K047 Periapical abscess without sinus: Secondary | ICD-10-CM

## 2022-03-13 MED ORDER — PENICILLIN V POTASSIUM 500 MG PO TABS: 500.0000 mg | ORAL_TABLET | Freq: Four times a day (QID) | ORAL | 0 refills | Status: AC

## 2022-03-13 MED ORDER — NAPROXEN 500 MG PO TABS: 500.0000 mg | ORAL_TABLET | Freq: Two times a day (BID) | ORAL | 0 refills | Status: DC | PRN

## 2022-03-13 NOTE — ED Triage Notes (Signed)
Pt reports has a tooth on right side that is needing to come out but in between jobs so doesn't have insurance. Tooth been bothering him for about 2 months but worsening pains and breaking off yesterday.  ?

## 2022-03-13 NOTE — Discharge Instructions (Signed)
I sent a prescription to the pharmacy for an antibiotic for you as well as an anti-inflammatory medication that can help with your pain and inflammation.  Ultimately, you need to be seen by a dentist to have this drained so that the infection does not come back and does not spread.  If you have significant worsening of your pain, especially if it radiates more into your jaw, you are very tender in your jaw and face, you develop fever, difficulty swallowing, you should be seen at the emergency room right away. ?

## 2022-03-13 NOTE — ED Provider Notes (Signed)
?[facility] ? ? ? ?CSN: [id number] ?Arrival date & time: 03/13/22  1038 ? ? ?  ? ?History   ?Chief Complaint ?Chief Complaint  ?Patient presents with  ? Dental Injury  ? ? ?HPI ?Xavier Hill is a 36 y.o. male.  ? ?Dental Pain ?Starting in a few days ago ?Pain is in the same tooth as it was before ?Also seen for this in January and prescribed Augmentin  ?States that he was on antibiotics prior to January that were prescribed by a dentist and that does seem to help more than the medication prescribed in January, but he is unsure the name ?States that part of his tooth seem to have broken off last night ?States that he is still having issues affording a dentist ?He denies any fevers and chills ?He states that the pain does radiate up to the jaw and across his mouth, but does not specifically have pain or tenderness in this area ?He has been able to eat and drink without difficulty ?No difficulty swallowing ?Otherwise feeling well ? ? ?History reviewed. No pertinent past medical history. ? ?Patient Active Problem List  ? Diagnosis Date Noted  ? Paresthesia of both lower extremities 09/14/2020  ? ? ?History reviewed. No pertinent surgical history. ? ? ? ? ?Home Medications   ? ?Prior to Admission medications   ?Medication Sig Start Date End Date Taking? Authorizing Provider  ?naproxen (NAPROSYN) 500 MG tablet Take 1 tablet (500 mg total) by mouth 2 (two) times daily as needed (pain). 03/13/22  Yes Safa Derner, Solmon Ice, DO  ?penicillin v potassium (VEETID) 500 MG tablet Take 1 tablet (500 mg total) by mouth 4 (four) times daily for 10 days. 03/13/22 03/23/22 Yes Zissy Hamlett, Solmon Ice, DO  ?clotrimazole (LOTRIMIN) 1 % cream Apply to affected area 2 times daily 09/12/20   Darr, Gerilyn Pilgrim, PA-C  ?ibuprofen (ADVIL) 800 MG tablet Take 1 tablet (800 mg total) by mouth 3 (three) times daily. 01/26/22   Durward Parcel, FNP  ?predniSONE (DELTASONE) 5 MG tablet Take 6 pills for first day, 5 pills second day, 4 pills third  day, 3 pills fourth day, 2 pills the fifth day, and 1 pill sixth day. 09/13/20   Myra Rude, MD  ?sulfamethoxazole-trimethoprim (BACTRIM DS) 800-160 MG tablet Take 1 tablet by mouth 2 (two) times daily. 08/14/20   Myra Rude, MD  ? ? ?Family History ?Family History  ?Problem Relation Age of Onset  ? Healthy Mother   ? Healthy Father   ? ? ?Social History ?Social History  ? ?Tobacco Use  ? Smoking status: Never  ? Smokeless tobacco: Never  ?Vaping Use  ? Vaping Use: Never used  ?Substance Use Topics  ? Alcohol use: Yes  ?  Comment: socially  ? Drug use: Yes  ?  Types: Marijuana  ?  Comment: daily  ? ? ? ?Allergies   ?Patient has no known allergies. ? ? ?Review of Systems ?Review of Systems  ?All other systems reviewed and are negative. ?Per HPI ? ?Physical Exam ?Triage Vital Signs ?ED Triage Vitals  ?Enc Vitals Group  ?   BP 03/13/22 1220 (!) 130/91  ?   Pulse Rate 03/13/22 1220 (!) 54  ?   Resp 03/13/22 1220 15  ?   Temp 03/13/22 1220 98.3 ?F (36.8 ?C)  ?   Temp Source 03/13/22 1220 Oral  ?   SpO2 03/13/22 1220 99 %  ?   Weight --   ?  Height --   ?   Head Circumference --   ?   Peak Flow --   ?   Pain Score 03/13/22 1218 7  ?   Pain Loc --   ?   Pain Edu? --   ?   Excl. in [place]? --   ? ?No data found. ? ?Updated Vital Signs ?BP (!) 130/91 (BP Location: Left Arm)   Pulse (!) 54   Temp 98.3 ?F (36.8 ?C) (Oral)   Resp 15   SpO2 99%  ? ?Visual Acuity ?Right Eye Distance:   ?Left Eye Distance:   ?Bilateral Distance:   ? ?Right Eye Near:   ?Left Eye Near:    ?Bilateral Near:    ? ?Physical Exam ?Constitutional:   ?   General: He is not in acute distress. ?   Appearance: Normal appearance. He is not ill-appearing.  ?HENT:  ?   Head: Normocephalic and atraumatic.  ?   Jaw: No tenderness or pain on movement.  ?   Mouth/Throat:  ? ?Eyes:  ?   Conjunctiva/sclera: Conjunctivae normal.  ?Cardiovascular:  ?   Rate and Rhythm: Normal rate.  ?Pulmonary:  ?   Effort: Pulmonary effort is normal. No respiratory  distress.  ?Musculoskeletal:  ?   Cervical back: Normal range of motion.  ?Skin: ?   General: Skin is warm and dry.  ?Neurological:  ?   Mental Status: He is alert and oriented to person, place, and time.  ?Psychiatric:     ?   Mood and Affect: Mood normal.     ?   Behavior: Behavior normal.  ? ? ? ?UC Treatments / Results  ?Labs ?(all labs ordered are listed, but only abnormal results are displayed) ?Labs Reviewed - No data to display ? ?EKG ? ? ?Radiology ?No results found. ? ?Procedures ?Procedures (including critical care time) ? ?Medications Ordered in UC ?Medications - No data to display ? ?Initial Impression / Assessment and Plan / UC Course  ?I have reviewed the triage vital signs and the nursing notes. ? ?Pertinent labs & imaging results that were available during my care of the patient were reviewed by me and considered in my medical decision making (see chart for details). ? ?  ? ?Poor dentition with likely abscess and broken tooth on examination today.  Discussed with patient that while we could treat him with another round of antibiotics, ultimately he needs to be setting by a dentist in order to treat what is likely an abscess by drainage.  He was provided a prescription for penicillin to take for 10 days, naproxen for pain and inflammation.  Given a list of low-cost dental resources.  Given return precautions, see AVS. ? ? ?Final Clinical Impressions(s) / UC Diagnoses  ? ?Final diagnoses:  ?Dental infection  ? ? ? ?Discharge Instructions   ? ?  ?I sent a prescription to the pharmacy for an antibiotic for you as well as an anti-inflammatory medication that can help with your pain and inflammation.  Ultimately, you need to be seen by a dentist to have this drained so that the infection does not come back and does not spread.  If you have significant worsening of your pain, especially if it radiates more into your jaw, you are very tender in your jaw and face, you develop fever, difficulty swallowing,  you should be seen at the emergency room right away. ? ? ? ? ?ED Prescriptions   ? ? Medication Sig Dispense Auth.  Provider  ? penicillin v potassium (VEETID) 500 MG tablet Take 1 tablet (500 mg total) by mouth 4 (four) times daily for 10 days. 40 tablet Jannie Doyle, Bernita Raisin, DO  ? naproxen (NAPROSYN) 500 MG tablet Take 1 tablet (500 mg total) by mouth 2 (two) times daily as needed (pain). 30 tablet Rahmir Beever, Bernita Raisin, DO  ? ?  ? ?PDMP not reviewed this encounter. ?  ?Cleophas Dunker, DO ?03/13/22 1243 ? ?

## 2022-05-15 ENCOUNTER — Ambulatory Visit (HOSPITAL_COMMUNITY)
Admission: EM | Admit: 2022-05-15 | Discharge: 2022-05-15 | Disposition: A | Discharge status: Home / Self Care | Payer: Self-pay | Attending: Family Medicine | Admitting: Family Medicine

## 2022-05-15 ENCOUNTER — Encounter (HOSPITAL_COMMUNITY): Payer: Self-pay

## 2022-05-15 DIAGNOSIS — K0889 Other specified disorders of teeth and supporting structures: Secondary | ICD-10-CM

## 2022-05-15 MED ORDER — AMOXICILLIN 875 MG PO TABS: 875.0000 mg | ORAL_TABLET | Freq: Two times a day (BID) | ORAL | 0 refills | Status: AC

## 2022-05-15 NOTE — ED Triage Notes (Signed)
73mo h/o top right tooth pain. Pt has already been to a dentist and he is waiting for an appt for an extraction. Has been taking leftover naproxen from the last time he was seen at [facility] with relief. ?

## 2022-05-16 NOTE — ED Provider Notes (Signed)
  [facility]   [id number] 05/15/22 Arrival Time: 1144  ASSESSMENT & PLAN:  1. Pain, dental    No sign of abscess requiring I&D at this time. Discussed.  Begin: Meds ordered this encounter  Medications   amoxicillin (AMOXIL) 875 MG tablet    Sig: Take 1 tablet (875 mg total) by mouth 2 (two) times daily for 10 days.    Dispense:  20 tablet    Refill:  0   OTC ibuprofen. May f/u as needed. Does have planned dental f/u.  Reviewed expectations re: course of current medical issues. Questions answered. Outlined signs and symptoms indicating need for more acute intervention. Patient verbalized understanding. After Visit Summary given.   SUBJECTIVE:  Xavier Hill is a 36 y.o. male who reports gradual onset of right upper dental pain described as aching/throbbing. Present for several days. Has been dealing with this for quite awhile with freq exacerbations that respond to antibiotic tx. Fever: absent. Tolerating PO intake but reports pain with chewing. Normal swallowing. He does not see a dentist regularly. No neck swelling or pain. OTC analgesics without relief.  OBJECTIVE: Vitals:   05/15/22 1303  BP: 124/78  Pulse: (!) 59  Resp: 18  Temp: 98.2 F (36.8 C)  TempSrc: Oral  SpO2: 100%    General appearance: alert; no distress HENT: normocephalic; atraumatic; dentition: fair; right upper gum without areas of fluctuance, drainage, or bleeding and with tenderness to palpation; normal jaw movement without difficulty Neck: supple without LAD; FROM; trachea midline Lungs: normal respirations; unlabored; speaks full sentences without difficulty Skin: warm and dry Psychological: alert and cooperative; normal mood and affect  No Known Allergies  History reviewed. No pertinent past medical history. Social History   Socioeconomic History   Marital status: Single    Spouse name: Not on file   Number of children: Not on file   Years of education: Not on file    Highest education level: Not on file  Occupational History   Not on file  Tobacco Use   Smoking status: Never   Smokeless tobacco: Never  Vaping Use   Vaping Use: Never used  Substance and Sexual Activity   Alcohol use: Yes    Comment: socially   Drug use: Yes    Types: Marijuana    Comment: daily   Sexual activity: Not on file  Other Topics Concern   Not on file  Social History Narrative   Not on file   Social Determinants of Health   Financial Resource Strain: Not on file  Food Insecurity: Not on file  Transportation Needs: Not on file  Physical Activity: Not on file  Stress: Not on file  Social Connections: Not on file  Intimate Partner Violence: Not on file   Family History  Problem Relation Age of Onset   Healthy Mother    Healthy Father    History reviewed. No pertinent surgical history.    Mardella Layman, MD 05/16/22 [phone]

## 2022-06-14 ENCOUNTER — Ambulatory Visit
Admission: EM | Admit: 2022-06-14 | Discharge: 2022-06-14 | Disposition: A | Discharge status: Home / Self Care | Payer: Self-pay | Attending: Emergency Medicine | Admitting: Emergency Medicine

## 2022-06-14 DIAGNOSIS — Z202 Contact with and (suspected) exposure to infections with a predominantly sexual mode of transmission: Secondary | ICD-10-CM

## 2022-06-14 MED ORDER — DOXYCYCLINE HYCLATE 100 MG PO CAPS: 100.0000 mg | ORAL_CAPSULE | Freq: Two times a day (BID) | ORAL | 0 refills | Status: AC

## 2022-06-14 NOTE — ED Provider Notes (Signed)
[facility]    CSN: [id number] Arrival date & time: 06/14/22  1753    HISTORY   Chief Complaint  Patient presents with   Exposure to STD   HPI Xavier Hill is a 36 y.o. male. Patient presents to urgent care stating that his partner tested positive for chlamydia.  Patient states he is currently not experiencing any penile discharge, burning with urination, testicular or scrotal discomfort or swelling, genital lesions.  The history is provided by the patient.   History reviewed. No pertinent past medical history. Patient Active Problem List   Diagnosis Date Noted   Paresthesia of both lower extremities 09/14/2020   History reviewed. No pertinent surgical history.  Home Medications    Prior to Admission medications   Not on File   Family History Family History  Problem Relation Age of Onset   Healthy Mother    Healthy Father    Social History Social History   Tobacco Use   Smoking status: Never   Smokeless tobacco: Never  Vaping Use   Vaping Use: Never used  Substance Use Topics   Alcohol use: Yes    Comment: socially   Drug use: Yes    Types: Marijuana    Comment: daily   Allergies   Patient has no known allergies.  Review of Systems Review of Systems Pertinent findings noted in history of present illness.   Physical Exam Triage Vital Signs ED Triage Vitals  Enc Vitals Group     BP 10/26/21 0827 (!) 147/82     Pulse Rate 10/26/21 0827 72     Resp 10/26/21 0827 18     Temp 10/26/21 0827 98.3 F (36.8 C)     Temp Source 10/26/21 0827 Oral     SpO2 10/26/21 0827 98 %     Weight --      Height --      Head Circumference --      Peak Flow --      Pain Score 10/26/21 0826 5     Pain Loc --      Pain Edu? --      Excl. in [place]? --   No data found.  Updated Vital Signs BP (!) 162/94 (BP Location: Right Arm)   Pulse 68   Temp 99.1 F (37.3 C)   Resp 16   SpO2 96%   Physical Exam Vitals and nursing note reviewed.   Constitutional:      General: He is not in acute distress.    Appearance: Normal appearance. He is not ill-appearing.  HENT:     Head: Normocephalic and atraumatic.  Eyes:     General: Lids are normal.        Right eye: No discharge.        Left eye: No discharge.     Extraocular Movements: Extraocular movements intact.     Conjunctiva/sclera: Conjunctivae normal.     Right eye: Right conjunctiva is not injected.     Left eye: Left conjunctiva is not injected.  Neck:     Trachea: Trachea and phonation normal.  Cardiovascular:     Rate and Rhythm: Normal rate and regular rhythm.     Pulses: Normal pulses.     Heart sounds: Normal heart sounds. No murmur heard.    No friction rub. No gallop.  Pulmonary:     Effort: Pulmonary effort is normal. No accessory muscle usage, prolonged expiration or respiratory distress.     Breath sounds: Normal breath  sounds. No stridor, decreased air movement or transmitted upper airway sounds. No decreased breath sounds, wheezing, rhonchi or rales.  Chest:     Chest wall: No tenderness.  Genitourinary:    Comments: Pt politely declines GU exam, pt did provide a penile swab for testing.   Musculoskeletal:        General: Normal range of motion.     Cervical back: Normal range of motion and neck supple. Normal range of motion.  Lymphadenopathy:     Cervical: No cervical adenopathy.  Skin:    General: Skin is warm and dry.     Findings: No erythema or rash.  Neurological:     General: No focal deficit present.     Mental Status: He is alert and oriented to person, place, and time.  Psychiatric:        Mood and Affect: Mood normal.        Behavior: Behavior normal.     Visual Acuity Right Eye Distance:   Left Eye Distance:   Bilateral Distance:    Right Eye Near:   Left Eye Near:    Bilateral Near:     UC Couse / Diagnostics / Procedures:    EKG  Radiology No results found.  Procedures Procedures (including critical care  time)  UC Diagnoses / Final Clinical Impressions(s)   I have reviewed the triage vital signs and the nursing notes.  Pertinent labs & imaging results that were available during my care of the patient were reviewed by me and considered in my medical decision making (see chart for details).    Final diagnoses:  Exposure to chlamydia   Patient was provided with Doxycycline 100 mg twice daily for 7 days for empiric treatment of presumed chlamydia based on the history provided to me today.   Patient was advised to abstain from sexual intercourse for the next 7 days while being treated.  Patient was also advised to use condoms to protect themselves from STD exposure. STD screening was performed, patient advised that the results be posted to their MyChart and if any of the results are positive, they will be notified by phone, further treatment will be provided as indicated based on results of STD screening. Return precautions advised.  Drug allergies reviewed, all questions addressed.     ED Prescriptions     Medication Sig Dispense Auth. Provider   doxycycline (VIBRAMYCIN) 100 MG capsule Take 1 capsule (100 mg total) by mouth 2 (two) times daily for 7 days. 14 capsule Theadora Rama Scales, PA-C      PDMP not reviewed this encounter.  Pending results:  Labs Reviewed  CYTOLOGY, (ORAL, ANAL, URETHRAL) ANCILLARY ONLY    Medications Ordered in UC: Medications - No data to display  Disposition Upon Discharge:  Condition: stable for discharge home  Patient presented with concern for an acute illness with associated systemic symptoms and significant discomfort requiring urgent management. In my opinion, this is a condition that a prudent lay person (someone who possesses an average knowledge of health and medicine) may potentially expect to result in complications if not addressed urgently such as respiratory distress, impairment of bodily function or dysfunction of bodily organs.   As  such, the patient has been evaluated and assessed, work-up was performed and treatment was provided in alignment with urgent care protocols and evidence based medicine.  Patient/parent/caregiver has been advised that the patient may require follow up for further testing and/or treatment if the symptoms continue in spite of treatment,  as clinically indicated and appropriate.  Routine symptom specific, illness specific and/or disease specific instructions were discussed with the patient and/or caregiver at length.  Prevention strategies for avoiding STD exposure were also discussed.  The patient will follow up with their current PCP if and as advised. If the patient does not currently have a PCP we will assist them in obtaining one.   The patient may need specialty follow up if the symptoms continue, in spite of conservative treatment and management, for further workup, evaluation, consultation and treatment as clinically indicated and appropriate.  Patient/parent/caregiver verbalized understanding and agreement of plan as discussed.  All questions were addressed during visit.  Please see discharge instructions below for further details of plan.  Discharge Instructions:   Discharge Instructions      Based on the history that you provided to me today, you were treated empirically for chlamydia with a prescription for doxycycline, 1 tablet twice daily for the next 7 days.  Please take all tablets as prescribed, do not skip doses.  Failure to take all doses as prescribed can result in a worsening infection that will be more difficult to treat and resolve.  Please abstain from sexual intercourse for 7 days.   The results of your STD testing today will be made available to you once they are complete, this typically takes 3 to 5 days.  They will initially be posted to your MyChart and, if any of your results are abnormal, you will receive a phone call with those results along with further instructions  regarding any further treatment, if needed.    Thank you for visiting urgent care today.  I appreciate the opportunity to participate in your care.       This office note has been dictated using Teaching laboratory technician.  Unfortunately, and despite my best efforts, this method of dictation can sometimes lead to occasional typographical or grammatical errors.  I apologize in advance if this occurs.      Theadora Rama Scales, PA-C 06/14/22 1819

## 2022-06-14 NOTE — Discharge Instructions (Addendum)
Based on the history that you provided to me today, you were treated empirically for chlamydia with a prescription for doxycycline, 1 tablet twice daily for the next 7 days.  Please take all tablets as prescribed, do not skip doses.  Failure to take all doses as prescribed can result in a worsening infection that will be more difficult to treat and resolve.  Please abstain from sexual intercourse for 7 days.   The results of your STD testing today will be made available to you once they are complete, this typically takes 3 to 5 days.  They will initially be posted to your MyChart and, if any of your results are abnormal, you will receive a phone call with those results along with further instructions regarding any further treatment, if needed.    Thank you for visiting urgent care today.  I appreciate the opportunity to participate in your care.

## 2022-06-14 NOTE — ED Triage Notes (Signed)
Pt requesting to be tested for STD's, the patient denies having any symptoms. The patient states his partner tested positive for chlamydia.

## 2022-06-17 LAB — CYTOLOGY, (ORAL, ANAL, URETHRAL) ANCILLARY ONLY
Chlamydia: NEGATIVE
Comment: NEGATIVE
Comment: NEGATIVE
Comment: NORMAL
Neisseria Gonorrhea: NEGATIVE
Trichomonas: NEGATIVE

## 2022-08-23 ENCOUNTER — Ambulatory Visit
Admission: EM | Admit: 2022-08-23 | Discharge: 2022-08-23 | Disposition: A | Discharge status: Home / Self Care | Payer: Self-pay | Attending: Emergency Medicine | Admitting: Emergency Medicine

## 2022-08-23 ENCOUNTER — Ambulatory Visit (INDEPENDENT_AMBULATORY_CARE_PROVIDER_SITE_OTHER): Payer: Self-pay

## 2022-08-23 DIAGNOSIS — M542 Cervicalgia: Secondary | ICD-10-CM

## 2022-08-23 DIAGNOSIS — M7918 Myalgia, other site: Secondary | ICD-10-CM | POA: Insufficient documentation

## 2022-08-23 DIAGNOSIS — Z202 Contact with and (suspected) exposure to infections with a predominantly sexual mode of transmission: Secondary | ICD-10-CM | POA: Insufficient documentation

## 2022-08-23 DIAGNOSIS — M25511 Pain in right shoulder: Secondary | ICD-10-CM

## 2022-08-23 MED ORDER — DOXYCYCLINE HYCLATE 100 MG PO CAPS: 100.0000 mg | ORAL_CAPSULE | Freq: Two times a day (BID) | ORAL | 0 refills | Status: AC

## 2022-08-23 NOTE — ED Triage Notes (Addendum)
The pt states 3 weeks ago he had a football injury. The patient has neck pain and collar bone pain to the right side.   The patient states he might have been exposed to chlamydia, he denies having any symptoms at this time.  Home interventions: tylenol

## 2022-08-23 NOTE — ED Provider Notes (Signed)
[facility]    CSN: [id number] Arrival date & time: 08/23/22  1442    HISTORY   Chief Complaint  Patient presents with   Shoulder Pain   Neck Pain   HPI Xavier Hill is a pleasant, 36 y.o. male who presents to urgent care today. The pt states 3 weeks ago he had a football injury still having pain on the right side of his neck, right collarbone out to his shoulder.  Patient states has been taking Tylenol for his pain which has not been helpful.  Patient also complains of possibly being exposed to chlamydia.  Patient states he is not having any symptoms of STD at this time.  Denies penile discharge, genital lesions, burning with urination, perineal pain, testicular/scrotal pain/swelling.  The history is provided by the patient.   History reviewed. No pertinent past medical history. Patient Active Problem List   Diagnosis Date Noted   Paresthesia of both lower extremities 09/14/2020   History reviewed. No pertinent surgical history.  Home Medications    Prior to Admission medications   Medication Sig Start Date End Date Taking? Authorizing Provider  doxycycline (VIBRAMYCIN) 100 MG capsule Take 1 capsule (100 mg total) by mouth 2 (two) times daily for 7 days. 08/23/22 08/30/22 Yes Theadora Rama Scales, PA-C    Family History Family History  Problem Relation Age of Onset   Healthy Mother    Healthy Father    Social History Social History   Tobacco Use   Smoking status: Never   Smokeless tobacco: Never  Vaping Use   Vaping Use: Never used  Substance Use Topics   Alcohol use: Yes    Comment: socially   Drug use: Yes    Types: Marijuana    Comment: daily   Allergies   Patient has no known allergies.  Review of Systems Review of Systems Pertinent findings revealed after performing a 14 point review of systems has been noted in the history of present illness.  Physical Exam Triage Vital Signs ED Triage Vitals  Enc Vitals Group     BP 10/26/21  0827 (!) 147/82     Pulse Rate 10/26/21 0827 72     Resp 10/26/21 0827 18     Temp 10/26/21 0827 98.3 F (36.8 C)     Temp Source 10/26/21 0827 Oral     SpO2 10/26/21 0827 98 %     Weight --      Height --      Head Circumference --      Peak Flow --      Pain Score 10/26/21 0826 5     Pain Loc --      Pain Edu? --      Excl. in GC? --    Updated Vital Signs BP 117/81 (BP Location: Left Arm)   Pulse (!) 58   Temp 98.7 F (37.1 C) (Oral)   Resp 18   SpO2 98%   Physical Exam Vitals and nursing note reviewed.  Constitutional:      General: He is not in acute distress.    Appearance: Normal appearance. He is not ill-appearing.  HENT:     Head: Normocephalic and atraumatic.  Eyes:     General: Lids are normal.        Right eye: No discharge.        Left eye: No discharge.     Extraocular Movements: Extraocular movements intact.     Conjunctiva/sclera: Conjunctivae normal.  Right eye: Right conjunctiva is not injected.     Left eye: Left conjunctiva is not injected.  Neck:     Trachea: Trachea and phonation normal.  Cardiovascular:     Rate and Rhythm: Normal rate and regular rhythm.     Pulses: Normal pulses.     Heart sounds: Normal heart sounds. No murmur heard.    No friction rub. No gallop.  Pulmonary:     Effort: Pulmonary effort is normal. No accessory muscle usage, prolonged expiration or respiratory distress.     Breath sounds: Normal breath sounds. No stridor, decreased air movement or transmitted upper airway sounds. No decreased breath sounds, wheezing, rhonchi or rales.  Chest:     Chest wall: No tenderness.  Genitourinary:    Comments: Pt politely declines GU exam, pt did provide a penile swab for testing.   Musculoskeletal:        General: Normal range of motion.     Cervical back: Normal range of motion and neck supple. Normal range of motion.  Lymphadenopathy:     Cervical: No cervical adenopathy.  Skin:    General: Skin is warm and dry.      Findings: No erythema or rash.  Neurological:     General: No focal deficit present.     Mental Status: He is alert and oriented to person, place, and time.  Psychiatric:        Mood and Affect: Mood normal.        Behavior: Behavior normal.     [facility] / Diagnostics / Procedures:     Radiology DG Clavicle Right  Result Date: 08/23/2022 CLINICAL DATA:  Right clavicular pain.  Football injury EXAM: RIGHT CLAVICLE - 2+ VIEWS COMPARISON:  None Available. FINDINGS: There is no evidence of fracture or other focal bone lesions. Normal alignment. Soft tissues are unremarkable. IMPRESSION: Negative. Electronically Signed   By: Duanne Guess D.O.   On: 08/23/2022 16:25   DG Cervical Spine Complete  Result Date: 08/23/2022 CLINICAL DATA:  Neck pain, football injury EXAM: CERVICAL SPINE - COMPLETE 4+ VIEW COMPARISON:  02/02/2020 FINDINGS: There is no evidence of cervical spine fracture or prevertebral soft tissue swelling. Straightening of the cervical lordosis. No traumatic listhesis. Disc heights are preserved. Patent bony foramina bilaterally. No other significant bone abnormalities are identified. IMPRESSION: Negative cervical spine radiographs. Electronically Signed   By: Duanne Guess D.O.   On: 08/23/2022 16:24    Procedures Procedures (including critical care time) EKG  Pending results:  Labs Reviewed  CYTOLOGY, (ORAL, ANAL, URETHRAL) ANCILLARY ONLY    Medications Ordered in UC: Medications - No data to display  UC Diagnoses / Final Clinical Impressions(s)   I have reviewed the triage vital signs and the nursing notes.  Pertinent labs & imaging results that were available during my care of the patient were reviewed by me and considered in my medical decision making (see chart for details).    Final diagnoses:  Exposure to chlamydia  Muscle pain, myofascial  Patient advised of his x-ray findings, recommend anti-inflammatory pain medications both oral and topical.  Based  on history provided by patient, will treat empirically for presumed chlamydia given possible exposure.  STD testing performed, patient advised to abstain from sexual intercourse while taking doxycycline.  ED Prescriptions     Medication Sig Dispense Auth. Provider   doxycycline (VIBRAMYCIN) 100 MG capsule Take 1 capsule (100 mg total) by mouth 2 (two) times daily for 7 days. 14 capsule Lequita Halt,  Lillia Abed Scales, PA-C      PDMP not reviewed this encounter.  Discharge Instructions:   Discharge Instructions      The x-rays of your collarbone and neck which give [place] a nice view of your shoulder are all completely normal, there is no concern for fracture or other acute injury.  I recommend that you consider taking ibuprofen for pain as needed as Tylenol is not a good anti-inflammatory pain medication.  You could also consider applying topical Voltaren gel which can be purchased at the pharmacy 4 times daily as needed for pain as well.  Based on the symptoms and concerns you shared with me today, you were treated for presumed chlamydia with a prescription for doxycycline, 1 tablet twice daily for the next 7 days.  Please take all tablets as prescribed, do not skip doses.  Failure to take all doses as prescribed can result in a worsening infection that will be more difficult to treat and resolve.  Please abstain from sexual intercourse of any kind, vaginal, oral or anal, for 7 days.   The results of your STD testing today which tests for gonorrhea, chlamydia and trichomonas will be posted to your MyChart account in the next 3 to 5 days.  If any of your results are abnormal, you will receive a phone call regarding further treatment.  Additional prescriptions, if any are needed, will be provided for you at your pharmacy.   Please abstain from sexual intercourse of any kind, vaginal, oral or anal, until until you have received the results of your STD testing.   If you have not had complete resolution of  your symptoms after completing treatment, please return for repeat evaluation.   Thank you for visiting urgent care today.  I appreciate the opportunity to participate in your care.       Disposition Upon Discharge:  Condition: stable for discharge home Home: take medications as prescribed; routine discharge instructions as discussed; follow up as advised.  Patient presented with an acute illness with associated systemic symptoms and significant discomfort requiring urgent management. In my opinion, this is a condition that a prudent lay person (someone who possesses an average knowledge of health and medicine) may potentially expect to result in complications if not addressed urgently such as respiratory distress, impairment of bodily function or dysfunction of bodily organs.   Routine symptom specific, illness specific and/or disease specific instructions were discussed with the patient and/or caregiver at length.   As such, the patient has been evaluated and assessed, work-up was performed and treatment was provided in alignment with urgent care protocols and evidence based medicine.  Patient/parent/caregiver has been advised that the patient may require follow up for further testing and treatment if the symptoms continue in spite of treatment, as clinically indicated and appropriate.  Patient/parent/caregiver has been advised to report to orthopedic urgent care clinic or return to the [facility] or PCP in 3-5 days if no better; follow-up with orthopedics, PCP or the Emergency Department if new signs and symptoms develop or if the current signs or symptoms continue to change or worsen for further workup, evaluation and treatment as clinically indicated and appropriate  The patient will follow up with their current PCP if and as advised. If the patient does not currently have a PCP we will have assisted them in obtaining one.   The patient may need specialty follow up if the symptoms continue, in  spite of conservative treatment and management, for further workup, evaluation, consultation and  treatment as clinically indicated and appropriate.  Patient/parent/caregiver verbalized understanding and agreement of plan as discussed.  All questions were addressed during visit.  Please see discharge instructions below for further details of plan.  This office note has been dictated using Teaching laboratory technician.  Unfortunately, this method of dictation can sometimes lead to typographical or grammatical errors.  I apologize for your inconvenience in advance if this occurs.  Please do not hesitate to reach out to me if clarification is needed.      Theadora Rama Scales, [place] 08/23/22 [phone]

## 2022-08-23 NOTE — Discharge Instructions (Addendum)
The x-rays of your collarbone and neck which give [place] a nice view of your shoulder are all completely normal, there is no concern for fracture or other acute injury.  I recommend that you consider taking ibuprofen for pain as needed as Tylenol is not a good anti-inflammatory pain medication.  You could also consider applying topical Voltaren gel which can be purchased at the pharmacy 4 times daily as needed for pain as well.  Based on the symptoms and concerns you shared with me today, you were treated for presumed chlamydia with a prescription for doxycycline, 1 tablet twice daily for the next 7 days.  Please take all tablets as prescribed, do not skip doses.  Failure to take all doses as prescribed can result in a worsening infection that will be more difficult to treat and resolve.  Please abstain from sexual intercourse of any kind, vaginal, oral or anal, for 7 days.   The results of your STD testing today which tests for gonorrhea, chlamydia and trichomonas will be posted to your MyChart account in the next 3 to 5 days.  If any of your results are abnormal, you will receive a phone call regarding further treatment.  Additional prescriptions, if any are needed, will be provided for you at your pharmacy.   Please abstain from sexual intercourse of any kind, vaginal, oral or anal, until until you have received the results of your STD testing.   If you have not had complete resolution of your symptoms after completing treatment, please return for repeat evaluation.   Thank you for visiting urgent care today.  I appreciate the opportunity to participate in your care.

## 2022-08-26 LAB — CYTOLOGY, (ORAL, ANAL, URETHRAL) ANCILLARY ONLY
Chlamydia: NEGATIVE
Comment: NEGATIVE
Comment: NEGATIVE
Comment: NORMAL
Neisseria Gonorrhea: NEGATIVE
Trichomonas: NEGATIVE

## 2022-11-22 ENCOUNTER — Ambulatory Visit
Admission: EM | Admit: 2022-11-22 | Discharge: 2022-11-22 | Disposition: A | Discharge status: Home / Self Care | Payer: Self-pay | Attending: Urgent Care | Admitting: Urgent Care

## 2022-11-22 DIAGNOSIS — G8929 Other chronic pain: Secondary | ICD-10-CM | POA: Insufficient documentation

## 2022-11-22 DIAGNOSIS — R369 Urethral discharge, unspecified: Secondary | ICD-10-CM | POA: Insufficient documentation

## 2022-11-22 DIAGNOSIS — K047 Periapical abscess without sinus: Secondary | ICD-10-CM | POA: Insufficient documentation

## 2022-11-22 DIAGNOSIS — Z202 Contact with and (suspected) exposure to infections with a predominantly sexual mode of transmission: Secondary | ICD-10-CM | POA: Insufficient documentation

## 2022-11-22 DIAGNOSIS — K089 Disorder of teeth and supporting structures, unspecified: Secondary | ICD-10-CM | POA: Insufficient documentation

## 2022-11-22 MED ORDER — AMOXICILLIN-POT CLAVULANATE 875-125 MG PO TABS: 1.0000 | ORAL_TABLET | Freq: Two times a day (BID) | ORAL | 0 refills | Status: DC

## 2022-11-22 MED ORDER — CEFTRIAXONE SODIUM 500 MG IJ SOLR
500.0000 mg | INTRAMUSCULAR | Status: DC
  Administered 2022-11-22: 500 mg via INTRAMUSCULAR

## 2022-11-22 NOTE — ED Triage Notes (Signed)
Pt reports he has a toothache he has had since January. Took bc powder. Needs antibiotic for infected tooth.

## 2022-11-22 NOTE — ED Provider Notes (Signed)
[facility]  Note:  This document was prepared using Conservation officer, historic buildings and may include unintentional dictation errors.  MRN: [id number] DOB: September 01, 1986  Subjective:   Xavier Hill is a 36 y.o. male presenting for 2 concerns.  Patient would like treatment for chlamydia.  States that he has had penile discharge.  Had sex with a partner that he believes may have had chlamydia.  He did use a condom but it broke.  Denies dysuria, hematuria, urinary frequency, penile swelling, testicular pain, testicular swelling, anal pain, groin pain.  He also has recurrent moderate to severe right upper dental pain.  Has established the need for dental work through x-rays and an evaluation but has not pursued definitive treatment.    No current facility-administered medications for this encounter. No current outpatient medications on file.   No Known Allergies  No past medical history on file.   No past surgical history on file.  Family History  Problem Relation Age of Onset   Healthy Mother    Healthy Father     Social History   Tobacco Use   Smoking status: Never   Smokeless tobacco: Never  Vaping Use   Vaping Use: Never used  Substance Use Topics   Alcohol use: Yes    Comment: socially   Drug use: Yes    Types: Marijuana    Comment: daily    ROS   Objective:   Vitals: BP 139/76 (BP Location: Right Arm)   Pulse 66   Temp 98.4 F (36.9 C) (Oral)   Resp 20   SpO2 98%   Physical Exam Constitutional:      General: He is not in acute distress.    Appearance: Normal appearance. He is well-developed and normal weight. He is not ill-appearing, toxic-appearing or diaphoretic.  HENT:     Head: Normocephalic and atraumatic.     Right Ear: External ear normal.     Left Ear: External ear normal.     Nose: Nose normal.     Mouth/Throat:     Pharynx: Oropharynx is clear.   Eyes:     General: No scleral icterus.       Right eye: No  discharge.        Left eye: No discharge.     Extraocular Movements: Extraocular movements intact.  Cardiovascular:     Rate and Rhythm: Normal rate.  Pulmonary:     Effort: Pulmonary effort is normal.  Genitourinary:    Penis: Circumcised. Discharge present. No phimosis, paraphimosis, hypospadias, erythema, tenderness, swelling or lesions.   Musculoskeletal:     Cervical back: Normal range of motion.  Neurological:     Mental Status: He is alert and oriented to person, place, and time.  Psychiatric:        Mood and Affect: Mood normal.        Behavior: Behavior normal.        Thought Content: Thought content normal.        Judgment: Judgment normal.    IM ceftriaxone in clinic at 500 mg.  Assessment and Plan :   PDMP not reviewed this encounter.  1. Dental infection   2. Chronic dental pain   3. Penile discharge   4. Exposure to chlamydia     Recommended starting Augmentin for his dental infection.  Emphasized need for definitive treatment with dental specialist. Patient treated empirically given physical exam findings as per CDC guidelines with IM ceftriaxone, doxycycline as an outpatient.  Labs pending.   Counseled on safe sex practices including abstaining for 1 week following treatment.  Counseled patient on potential for adverse effects with medications prescribed/recommended today, ER and return-to-clinic precautions discussed, patient verbalized understanding.    Wallis Bamberg, PA-C 11/22/22 1335

## 2022-11-22 NOTE — Discharge Instructions (Signed)
Make sure you schedule an appointment with a dentist/dental surgeon as soon as possible.  You may try some of the resources below.    [facility] in [place], [place] Address: [address], , [place] [address] Phone: [phone]  [facility] [phone] extension [phone]  Dr. Civils [phone] [address]  [facility] [phone] [address]  [facility] [phone] extension [phone] [place], [place], [address] First come first serve for the first 10 clients.  May do simple extractions only, no wisdom teeth or surgery.  You may try the second for Thursday of the month starting at 6:30 AM.  [employer] You may call the school to see if they are still helping to provide dental care for emergent cases.  

## 2022-11-25 LAB — CYTOLOGY, (ORAL, ANAL, URETHRAL) ANCILLARY ONLY
Chlamydia: NEGATIVE
Comment: NEGATIVE
Comment: NEGATIVE
Comment: NORMAL
Neisseria Gonorrhea: NEGATIVE
Trichomonas: NEGATIVE

## 2023-04-14 ENCOUNTER — Encounter: Payer: Self-pay | Admitting: *Deleted

## 2023-12-11 ENCOUNTER — Ambulatory Visit
Admission: EM | Admit: 2023-12-11 | Discharge: 2023-12-11 | Disposition: A | Discharge status: Home / Self Care | Payer: Self-pay | Attending: Internal Medicine | Admitting: Internal Medicine

## 2023-12-11 DIAGNOSIS — K047 Periapical abscess without sinus: Secondary | ICD-10-CM | POA: Insufficient documentation

## 2023-12-11 DIAGNOSIS — Z113 Encounter for screening for infections with a predominantly sexual mode of transmission: Secondary | ICD-10-CM | POA: Insufficient documentation

## 2023-12-11 MED ORDER — AMOXICILLIN-POT CLAVULANATE 875-125 MG PO TABS: 1.0000 | ORAL_TABLET | Freq: Two times a day (BID) | ORAL | 0 refills | Status: DC

## 2023-12-11 MED ORDER — NAPROXEN 500 MG PO TABS: 500.0000 mg | ORAL_TABLET | Freq: Two times a day (BID) | ORAL | 0 refills | Status: AC

## 2023-12-11 NOTE — ED Triage Notes (Signed)
Pt reports dental pain and  headache x 1 week, Tylenol gives some relief.

## 2023-12-11 NOTE — ED Provider Notes (Addendum)
[facility]  Note:  This document was prepared using Conservation officer, historic buildings and may include unintentional dictation errors.  MRN: [id number] DOB: [birth year]  Subjective:   Xavier Hill is a 37 y.o. male presenting for 1 week history of persistent right sided dental pain.  Patient reports that he has a known dental issue.  Does not have a dentist currently.  Is waiting to get coverage.  Patient is also requesting a general sexually-transmitted infection test.  Denies dysuria, hematuria, urinary frequency, penile discharge, penile swelling, testicular pain, testicular swelling, anal pain, groin pain.  Patient tries to practice safe sex but unfortunately the last 2 times he had sex, the condom broke.  No current facility-administered medications for this encounter.  Current Outpatient Medications:    acetaminophen (TYLENOL) 500 MG tablet, Take 500 mg by mouth every 6 (six) hours as needed., Disp: , Rfl:    amoxicillin-clavulanate (AUGMENTIN) 875-125 MG tablet, Take 1 tablet by mouth 2 (two) times daily., Disp: 20 tablet, Rfl: 0   No Known Allergies  History reviewed. No pertinent past medical history.   History reviewed. No pertinent surgical history.  Family History  Problem Relation Age of Onset   Healthy Mother    Healthy Father     Social History   Tobacco Use   Smoking status: Never   Smokeless tobacco: Never  Vaping Use   Vaping status: Never Used  Substance Use Topics   Alcohol use: Yes    Comment: socially   Drug use: Yes    Types: Marijuana    Comment: daily    ROS   Objective:   Vitals: BP 133/84 (BP Location: Right Arm)   Pulse 73   Temp 99.2 F (37.3 C) (Oral)   Resp 16   SpO2 98%   Physical Exam Constitutional:      General: He is not in acute distress.    Appearance: Normal appearance. He is well-developed and normal weight. He is not ill-appearing, toxic-appearing or diaphoretic.  HENT:     Head:  Normocephalic and atraumatic.     Right Ear: External ear normal.     Left Ear: External ear normal.     Nose: Nose normal.     Mouth/Throat:     Pharynx: Oropharynx is clear.   Eyes:     General: No scleral icterus.       Right eye: No discharge.        Left eye: No discharge.     Extraocular Movements: Extraocular movements intact.     Pupils: Pupils are equal.  Neck:     Meningeal: Brudzinski's sign and Kernig's sign absent.  Cardiovascular:     Rate and Rhythm: Normal rate.  Pulmonary:     Effort: Pulmonary effort is normal.  Musculoskeletal:     Cervical back: Normal range of motion.  Neurological:     Mental Status: He is alert and oriented to person, place, and time.     Cranial Nerves: No cranial nerve deficit, dysarthria or facial asymmetry.     Motor: No weakness, abnormal muscle tone or pronator drift.     Coordination: Romberg sign negative. Coordination normal. Finger-Nose-Finger Test and Heel to [facility] Test normal. Rapid alternating movements normal.     Gait: Gait normal.     Deep Tendon Reflexes: Reflexes normal.  Psychiatric:        Mood and Affect: Mood normal.        Behavior: Behavior normal.  Thought Content: Thought content normal.        Judgment: Judgment normal.     Assessment and Plan :   PDMP not reviewed this encounter.  1. Dental abscess   2. Routine screening for STI (sexually transmitted infection)    Start Augmentin for dental infection/abscess, use naproxen for pain and inflammation. Emphasized need for dental surgeon consult. Counseled patient on potential for adverse effects with medications prescribed/recommended today, strict ER and return-to-clinic precautions discussed, patient verbalized understanding.  STI check pending.   Wallis Bamberg, [place] 12/11/23 4098

## 2023-12-11 NOTE — Discharge Instructions (Addendum)
Make sure you schedule an appointment with a dentist/dental surgeon as soon as possible.  You may try some of the resources below.    [facility] in [place], [place] Address: [address], [place], [place] [address] Phone: [phone]  [place] [phone] extension [phone] [address]  Dr. Donn Pierini [phone] [place] [phone] [address] [facility] [place].  [facility] [phone] extension [phone] [address], [place], [place], [address] First come first serve for the first 10 clients.  May do simple extractions only, no wisdom teeth or surgery.  You may try the second for Thursday of the month starting at [place] [employer] You may call the school to see if they are still helping to provide dental care for emergent cases.

## 2023-12-12 LAB — CYTOLOGY, (ORAL, ANAL, URETHRAL) ANCILLARY ONLY
Chlamydia: NEGATIVE
Comment: NEGATIVE
Comment: NEGATIVE
Comment: NORMAL
Neisseria Gonorrhea: NEGATIVE
Trichomonas: NEGATIVE

## 2023-12-12 LAB — HIV ANTIBODY (ROUTINE TESTING W REFLEX): HIV Screen 4th Generation wRfx: NONREACTIVE

## 2023-12-12 LAB — RPR: RPR Ser Ql: NONREACTIVE

## 2023-12-21 ENCOUNTER — Emergency Department (HOSPITAL_COMMUNITY)
Admission: EM | Admit: 2023-12-21 | Discharge: 2023-12-21 | Discharge status: Against Medical Advice | Payer: Self-pay | Attending: Emergency Medicine | Admitting: Emergency Medicine

## 2023-12-21 ENCOUNTER — Encounter (HOSPITAL_COMMUNITY): Payer: Self-pay

## 2023-12-21 DIAGNOSIS — R112 Nausea with vomiting, unspecified: Secondary | ICD-10-CM | POA: Insufficient documentation

## 2023-12-21 DIAGNOSIS — D72829 Elevated white blood cell count, unspecified: Secondary | ICD-10-CM | POA: Insufficient documentation

## 2023-12-21 DIAGNOSIS — Z5329 Procedure and treatment not carried out because of patient's decision for other reasons: Secondary | ICD-10-CM | POA: Insufficient documentation

## 2023-12-21 DIAGNOSIS — R197 Diarrhea, unspecified: Secondary | ICD-10-CM | POA: Insufficient documentation

## 2023-12-21 LAB — COMPREHENSIVE METABOLIC PANEL
ALT: 20 U/L (ref 0–44)
AST: 24 U/L (ref 15–41)
Albumin: 4.3 g/dL (ref 3.5–5.0)
Alkaline Phosphatase: 57 U/L (ref 38–126)
Anion gap: 11 (ref 5–15)
BUN: 14 mg/dL (ref 6–20)
CO2: 20 mmol/L — ABNORMAL LOW (ref 22–32)
Calcium: 9.1 mg/dL (ref 8.9–10.3)
Chloride: 109 mmol/L (ref 98–111)
Creatinine, Ser: 1.05 mg/dL (ref 0.61–1.24)
GFR, Estimated: >60 mL/min (ref >60)
Glucose, Bld: 158 mg/dL — ABNORMAL HIGH (ref 70–99)
Potassium: 3.4 mmol/L — ABNORMAL LOW (ref 3.5–5.1)
Sodium: 140 mmol/L (ref 135–145)
Total Bilirubin: 0.6 mg/dL (ref <1.2)
Total Protein: 7.2 g/dL (ref 6.5–8.1)

## 2023-12-21 LAB — CBC
HCT: 39.5 % (ref 39.0–52.0)
Hemoglobin: 13.5 g/dL (ref 13.0–17.0)
MCH: 32.8 pg (ref 26.0–34.0)
MCHC: 34.2 g/dL (ref 30.0–36.0)
MCV: 95.9 fL (ref 80.0–100.0)
Platelets: 229 10*3/uL (ref 150–400)
RBC: 4.12 MIL/uL — ABNORMAL LOW (ref 4.22–5.81)
RDW: 13.4 % (ref 11.5–15.5)
WBC: 12.8 10*3/uL — ABNORMAL HIGH (ref 4.0–10.5)
nRBC: 0 % (ref 0.0–0.2)

## 2023-12-21 LAB — LIPASE, BLOOD: Lipase: 26 U/L (ref 11–51)

## 2023-12-21 MED ORDER — DROPERIDOL 2.5 MG/ML IJ SOLN
1.2500 mg | Freq: Once | INTRAMUSCULAR | Status: AC
  Administered 2023-12-21: 1.25 mg via INTRAVENOUS
  Filled 2023-12-21: qty 2

## 2023-12-21 MED ORDER — ONDANSETRON HCL 4 MG/2ML IJ SOLN
4.0000 mg | Freq: Once | INTRAMUSCULAR | Status: AC
  Administered 2023-12-21: 4 mg via INTRAVENOUS
  Filled 2023-12-21: qty 2

## 2023-12-21 MED ORDER — KETOROLAC TROMETHAMINE 30 MG/ML IJ SOLN
15.0000 mg | Freq: Once | INTRAMUSCULAR | Status: AC
  Administered 2023-12-21: 15 mg via INTRAVENOUS
  Filled 2023-12-21: qty 1

## 2023-12-21 NOTE — Discharge Instructions (Signed)
You left the emergency department without speaking to a provider.  This is very dangerous.  This may be 2.  Severe L, including permanent disability and death. If your symptoms worsen, immediately return to the department.

## 2023-12-21 NOTE — ED Notes (Signed)
The patient called me into the room saying he was ready to go-states he is not staying any longer. His mom talked him into letting me take the IV out. The IV was removed and he agreed to sign the [facility] form-mother signed as witness to same. The provider was notified

## 2023-12-21 NOTE — ED Triage Notes (Signed)
Pt ate [place] food at approx 1700 and began vomiting shortly after. Pt has had abdominal cramping and vomiting since

## 2023-12-21 NOTE — ED Provider Notes (Signed)
[place] EMERGENCY DEPARTMENT AT [facility] Provider Note   CSN: [id number] Arrival date & time: 12/21/23  2440     History  Chief Complaint  Patient presents with   Emesis    Xavier Hill is a 37 y.o. male.  Presenting to the ED for evaluation of nausea and vomiting.  States he ate chicken and fried rice from a [employer] at 4 PM yesterday.  He started to feel pain in his upper abdomen at 10 PM.  He woke up at 4:00 this morning with significant pain, nausea, vomiting and diarrhea.  He has had 5 episodes of vomiting.  He has had 1 episode of diarrhea.  He denies any chest pain or shortness of breath.  No fevers.  No urinary complaints.  Abdominal pain is localized to the epigastric region and described as a cramping sensation.  Overall alcohol use.  He endorses daily marijuana use.   Emesis Associated symptoms: diarrhea        Home Medications Prior to Admission medications   Medication Sig Start Date End Date Taking? Authorizing Provider  acetaminophen (TYLENOL) 500 MG tablet Take 500 mg by mouth every 6 (six) hours as needed.    [provider]  amoxicillin-clavulanate (AUGMENTIN) 875-125 MG tablet Take 1 tablet by mouth 2 (two) times daily. 12/11/23   Wallis Bamberg, PA-C  naproxen (NAPROSYN) 500 MG tablet Take 1 tablet (500 mg total) by mouth 2 (two) times daily with a meal. 12/11/23   Wallis Bamberg, PA-C      Allergies    Patient has no known allergies.    Review of Systems   Review of Systems  Gastrointestinal:  Positive for diarrhea, nausea and vomiting.  All other systems reviewed and are negative.   Physical Exam Updated Vital Signs BP (!) 157/102   Pulse (!) 108   Resp (!) 24   SpO2 99%  Physical Exam Vitals and nursing note reviewed.  Constitutional:      General: He is not in acute distress.    Appearance: He is well-developed.     Comments: Walking around in room, holding stomach  HENT:     Head: Normocephalic and  atraumatic.  Eyes:     Conjunctiva/sclera: Conjunctivae normal.  Cardiovascular:     Rate and Rhythm: Normal rate and regular rhythm.     Heart sounds: No murmur heard. Pulmonary:     Effort: Pulmonary effort is normal. No respiratory distress.     Breath sounds: Normal breath sounds.  Abdominal:     Palpations: Abdomen is soft.     Tenderness: There is no abdominal tenderness. There is no guarding.  Musculoskeletal:        General: No swelling.     Cervical back: Neck supple.  Skin:    General: Skin is warm and dry.     Capillary Refill: Capillary refill takes less than 2 seconds.  Neurological:     Mental Status: He is alert.  Psychiatric:        Mood and Affect: Mood normal.     ED Results / Procedures / Treatments   Labs (all labs ordered are listed, but only abnormal results are displayed) Labs Reviewed  COMPREHENSIVE METABOLIC PANEL - Abnormal; Notable for the following components:      Result Value   Potassium 3.4 (*)    CO2 20 (*)    Glucose, Bld 158 (*)    All other components within normal limits  CBC - Abnormal; Notable  for the following components:   WBC 12.8 (*)    RBC 4.12 (*)    All other components within normal limits  LIPASE, BLOOD  URINALYSIS, ROUTINE W REFLEX MICROSCOPIC    EKG None  Radiology No results found.  Procedures Procedures    Medications Ordered in ED Medications  ondansetron (ZOFRAN) injection 4 mg (4 mg Intravenous Given 12/21/23 0654)  ketorolac (TORADOL) 30 MG/ML injection 15 mg (15 mg Intravenous Given 12/21/23 0653)  droperidol (INAPSINE) 2.5 MG/ML injection 1.25 mg (1.25 mg Intravenous Given 12/21/23 8413)    ED Course/ Medical Decision Making/ A&P                                 Medical Decision Making Amount and/or Complexity of Data Reviewed Labs: ordered.  Risk Prescription drug management.  This patient presents to the ED for concern of nausea and vomiting, this involves an extensive number of treatment  options, and is a complaint that carries with it a high risk of complications and morbidity. The emergent differential diagnosis for vomiting includes, but is not limited to ACS/MI, DKA, Ischemic bowel, Meningitis, Sepsis, Acute gastric dilation, Adrenal insufficiency, Appendicitis,  Bowel obstruction/ileus, Carbon monoxide poisoning, Cholecystitis, Electrolyte abnormalities, Elevated ICP, Gastric outlet obstruction, Pancreatitis, Ruptured viscus, Biliary colic, Cannabinoid hyperemesis syndrome, Gastritis, Gastroenteritis, Gastroparesis,  Narcotic withdrawal, Peptic ulcer disease, and UTI   My initial workup includes labs, symptom control  Additional history obtained from: Nursing notes from this visit. Mother at bedside provides portion of history  I ordered, reviewed and interpreted labs which include: CBC, CMP, lipase, urinalysis bicarb mildly decreased to 20 with potassium 3.4, likely secondary to vomiting and diarrhea.  Leukocytosis of 12.8 likely secondary to vomiting  37 year old male presenting for evaluation of nausea, vomiting, diarrhea, abdominal pain.  Symptoms began last night.  Believes it may be due to food poisoning.  Also endorses daily marijuana use.  Lab workup was overall reassuring.  Patient reported continued vomiting after IV Zofran so droperidol was ordered for potential cannabis hyperemesis treatment.  After droperidol administration, patient eloped from the department.  He was unwilling to stay despite staff informing him of his awaiting medical advice.  I was unable to speak with the patient prior to his elopement.  Note: Portions of this report may have been transcribed using voice recognition software. Every effort was made to ensure accuracy; however, inadvertent computerized transcription errors may still be present.         Final Clinical Impression(s) / ED Diagnoses Final diagnoses:  Nausea vomiting and diarrhea    Rx / DC Orders ED Discharge Orders      None         Michelle Piper, PA-C 12/21/23 0831    Pricilla Loveless, MD 12/22/23 [phone]

## 2024-07-16 ENCOUNTER — Encounter: Payer: Self-pay | Admitting: Advanced Practice Midwife

## 2024-08-24 ENCOUNTER — Ambulatory Visit
Admission: EM | Admit: 2024-08-24 | Discharge: 2024-08-24 | Disposition: A | Discharge status: Home / Self Care | Payer: Self-pay | Source: Intra-hospital | Attending: Nurse Practitioner | Admitting: Nurse Practitioner

## 2024-08-24 DIAGNOSIS — L0591 Pilonidal cyst without abscess: Secondary | ICD-10-CM

## 2024-08-24 MED ORDER — CLINDAMYCIN HCL 300 MG PO CAPS: 300.0000 mg | ORAL_CAPSULE | Freq: Three times a day (TID) | ORAL | 0 refills | Status: AC

## 2024-08-24 NOTE — ED Triage Notes (Signed)
 I have a spider/insect bite that is just not healing on my lower back. My friend is a Engineer, civil (consulting) and said I need an antibiotic, it has drainage from it. No fever.

## 2024-08-24 NOTE — Discharge Instructions (Addendum)
 Take the antibiotic as prescribed.  Apply warm compresses frequently.  If this fails to resolve or worsens, please see your primary care provider for additional evaluation.

## 2024-08-24 NOTE — ED Provider Notes (Signed)
 [facility]    CSN: [id number] Arrival date & time: 08/24/24  1200      History   Chief Complaint Chief Complaint  Patient presents with   Insect Bite    HPI Xavier Hill is a 38 y.o. male.   Patient presents for evaluation of an area of skin on his lower back that has been intermittently draining and painful for the past month and a half.  Unclear what started this but feels that it may have been a spider bite.  States the area comes and goes, sometimes drains particularly after a shower.  No systemic symptoms such as fever, body aches, or chills.  No medications have been taken for these symptoms as of yet.  The history is provided by the patient.    History reviewed. No pertinent past medical history.  Patient Active Problem List   Diagnosis Date Noted   Paresthesia of both lower extremities 09/14/2020    History reviewed. No pertinent surgical history.     Home Medications    Prior to Admission medications   Medication Sig Start Date End Date Taking? Authorizing Provider  clindamycin  (CLEOCIN ) 300 MG capsule Take 1 capsule (300 mg total) by mouth 3 (three) times daily for 10 days. 08/24/24 09/03/24 Yes Janet Therisa PARAS, FNP  acetaminophen  (TYLENOL ) 500 MG tablet Take 500 mg by mouth every 6 (six) hours as needed.    [provider]  naproxen  (NAPROSYN ) 500 MG tablet Take 1 tablet (500 mg total) by mouth 2 (two) times daily with a meal. 12/11/23   Christopher Savannah, PA-C    Family History Family History  Problem Relation Age of Onset   Healthy Mother    Healthy Father     Social History Social History   Tobacco Use   Smoking status: Never   Smokeless tobacco: Never  Vaping Use   Vaping status: Never Used  Substance Use Topics   Alcohol use: Yes    Comment: socially   Drug use: Yes    Types: Marijuana    Comment: daily     Allergies   Patient has no known allergies.   Review of Systems Review of Systems  Constitutional:   Negative for chills, fatigue and fever.  HENT:  Negative for congestion, rhinorrhea and sore throat.   Eyes:  Negative for pain and redness.  Respiratory:  Negative for cough and shortness of breath.   Cardiovascular:  Negative for chest pain.  Gastrointestinal:  Negative for abdominal pain, diarrhea and vomiting.  Genitourinary:  Negative for dysuria.  Musculoskeletal:  Negative for back pain and neck pain.  Skin:  Positive for wound.  Neurological:  Negative for dizziness and headaches.  Psychiatric/Behavioral:  Negative for behavioral problems.     Physical Exam Triage Vital Signs ED Triage Vitals  Encounter Vitals Group     BP 08/24/24 1318 122/71     Girls Systolic BP Percentile --      Girls Diastolic BP Percentile --      Boys Systolic BP Percentile --      Boys Diastolic BP Percentile --      Pulse Rate 08/24/24 1318 66     Resp 08/24/24 1318 18     Temp 08/24/24 1318 99.1 F (37.3 C)     Temp Source 08/24/24 1318 Oral     SpO2 08/24/24 1318 97 %     Weight 08/24/24 1316 200 lb (90.7 kg)     Height 08/24/24 1316 6' (1.829 m)  Head Circumference --      Peak Flow --      Pain Score 08/24/24 1314 0     Pain Loc --      Pain Education --      Exclude from Growth Chart --    No data found.  Updated Vital Signs BP 122/71 (BP Location: Left Arm)   Pulse 66   Temp 99.1 F (37.3 C) (Oral)   Resp 18   Ht 6' (1.829 m)   Wt 200 lb (90.7 kg)   SpO2 97%   BMI 27.12 kg/m      Physical Exam Vitals and nursing note reviewed.  Constitutional:      Appearance: Normal appearance.  Cardiovascular:     Rate and Rhythm: Normal rate and regular rhythm.     Heart sounds: Normal heart sounds.  Pulmonary:     Effort: Pulmonary effort is normal.     Breath sounds: Normal breath sounds.  Abdominal:     General: Bowel sounds are normal.  Skin:    General: Skin is warm and dry.     Comments: There is an area of scabbing noted over the gluteal cleft, more predominantly  over the right.  There is underlying induration that tracks in between the buttocks - approximately 2 cm.  No open areas of draining or fluctuance. Minimally tender.  Neurological:     General: No focal deficit present.     Mental Status: He is alert and oriented to person, place, and time.  Psychiatric:        Mood and Affect: Mood normal.        Behavior: Behavior normal.        Thought Content: Thought content normal.        Judgment: Judgment normal.    UC Treatments / Results  Labs (all labs ordered are listed, but only abnormal results are displayed) Labs Reviewed - No data to display  EKG   Radiology No results found.  Procedures Procedures (including critical care time)  Medications Ordered in UC Medications - No data to display  Initial Impression / Assessment and Plan / UC Course  I have reviewed the triage vital signs and the nursing notes.  Pertinent labs & imaging results that were available during my care of the patient were reviewed by me and considered in my medical decision making (see chart for details).   Patient presents for evaluation of a wound to his lower back that has been present for approximately 1 1/2 months.  On physical exam, he has what appears to be a pilonidal cyst - not currently inflamed or draining.  However, since it has been draining prior to arrival and present for this length of time, will go ahead and treat with a course of antibiotics.  He is aware to follow up with his PCP for ongoing management and recommendations.   Final Clinical Impressions(s) / UC Diagnoses   Final diagnoses:  Pilonidal cyst     Discharge Instructions      Take the antibiotic as prescribed.  Apply warm compresses frequently.  If this fails to resolve or worsens, please see your primary care provider for additional evaluation.    ED Prescriptions     Medication Sig Dispense Auth. Provider   clindamycin  (CLEOCIN ) 300 MG capsule Take 1 capsule (300 mg  total) by mouth 3 (three) times daily for 10 days. 30 capsule Janet Therisa PARAS, FNP      PDMP not reviewed this  encounter.   Janet Therisa PARAS, FNP 08/24/24 [phone]

## 2024-08-24 NOTE — ED Triage Notes (Signed)
 Called patient who was waiting in car, he stated he would be right in, mins later, no response from lobby, tried again, no answer. Will try once more then remove.  WENDI Dixon CMA

## 2024-09-01 ENCOUNTER — Ambulatory Visit: Admission: EM | Admit: 2024-09-01 | Discharge: 2024-09-01 | Discharge status: Against Medical Advice | Payer: Self-pay

## 2024-09-01 DIAGNOSIS — Z5321 Procedure and treatment not carried out due to patient leaving prior to being seen by health care provider: Secondary | ICD-10-CM

## 2024-09-01 NOTE — ED Provider Notes (Signed)
 Patient called EMS from lobby- was evaluated by EMS prior to triage. Of note, patient had checked in to urgent care, then left the building, several hours later returned and then apparently called EMS. Patient was not evaluated by clinical staff in urgent care setting.    Billy Asberry FALCON, PA-C 09/01/24 1436

## 2024-09-01 NOTE — ED Triage Notes (Signed)
 EMS phoned by patient (from lobby).
